# Patient Record
Sex: Female | Born: 1990 | Race: Asian | Hispanic: No | Marital: Married | Smoking: Never smoker
Health system: Southern US, Community
[De-identification: ages and names within clinical notes are randomized; demographics above are authoritative.]

## PROBLEM LIST (undated history)

## (undated) DIAGNOSIS — E039 Hypothyroidism, unspecified: Secondary | ICD-10-CM

## (undated) DIAGNOSIS — E079 Disorder of thyroid, unspecified: Secondary | ICD-10-CM

## (undated) HISTORY — DX: Hypothyroidism, unspecified: E03.9

## (undated) HISTORY — DX: Disorder of thyroid, unspecified: E07.9

---

## 2014-05-13 ENCOUNTER — Other Ambulatory Visit: Payer: Self-pay | Admitting: Infectious Disease

## 2014-05-13 ENCOUNTER — Ambulatory Visit
Admission: RE | Admit: 2014-05-13 | Discharge: 2014-05-13 | Disposition: A | Discharge status: Home / Self Care | Payer: No Typology Code available for payment source | Source: Ambulatory Visit | Attending: Infectious Disease | Admitting: Infectious Disease

## 2014-05-13 DIAGNOSIS — R7612 Nonspecific reaction to cell mediated immunity measurement of gamma interferon antigen response without active tuberculosis: Secondary | ICD-10-CM

## 2015-07-29 DIAGNOSIS — Z0000 Encounter for general adult medical examination without abnormal findings: Secondary | ICD-10-CM | POA: Diagnosis not present

## 2015-07-29 DIAGNOSIS — N945 Secondary dysmenorrhea: Secondary | ICD-10-CM | POA: Diagnosis not present

## 2015-07-29 DIAGNOSIS — R5383 Other fatigue: Secondary | ICD-10-CM | POA: Diagnosis not present

## 2015-07-29 DIAGNOSIS — E559 Vitamin D deficiency, unspecified: Secondary | ICD-10-CM | POA: Diagnosis not present

## 2015-07-29 DIAGNOSIS — R0602 Shortness of breath: Secondary | ICD-10-CM | POA: Diagnosis not present

## 2015-07-29 DIAGNOSIS — E282 Polycystic ovarian syndrome: Secondary | ICD-10-CM | POA: Diagnosis not present

## 2015-07-29 DIAGNOSIS — Z1389 Encounter for screening for other disorder: Secondary | ICD-10-CM | POA: Diagnosis not present

## 2015-08-03 ENCOUNTER — Emergency Department (HOSPITAL_COMMUNITY): Payer: BLUE CROSS/BLUE SHIELD

## 2015-08-03 ENCOUNTER — Emergency Department (HOSPITAL_COMMUNITY)
Admission: EM | Admit: 2015-08-03 | Discharge: 2015-08-03 | Disposition: A | Discharge status: Home / Self Care | Payer: BLUE CROSS/BLUE SHIELD | Attending: Emergency Medicine | Admitting: Emergency Medicine

## 2015-08-03 ENCOUNTER — Encounter (HOSPITAL_COMMUNITY): Payer: Self-pay | Admitting: *Deleted

## 2015-08-03 DIAGNOSIS — R0602 Shortness of breath: Secondary | ICD-10-CM | POA: Insufficient documentation

## 2015-08-03 DIAGNOSIS — Z3202 Encounter for pregnancy test, result negative: Secondary | ICD-10-CM | POA: Diagnosis not present

## 2015-08-03 DIAGNOSIS — D649 Anemia, unspecified: Secondary | ICD-10-CM | POA: Diagnosis not present

## 2015-08-03 LAB — CBC WITH DIFFERENTIAL/PLATELET
Basophils Absolute: 0 10*3/uL (ref 0.0–0.1)
Basophils Relative: 0 %
EOS PCT: 2 %
Eosinophils Absolute: 0.2 10*3/uL (ref 0.0–0.7)
HEMATOCRIT: 31.9 % — AB (ref 36.0–46.0)
Hemoglobin: 9.7 g/dL — ABNORMAL LOW (ref 12.0–15.0)
Lymphocytes Relative: 27 %
Lymphs Abs: 2 10*3/uL (ref 0.7–4.0)
MCH: 21.3 pg — ABNORMAL LOW (ref 26.0–34.0)
MCHC: 30.4 g/dL (ref 30.0–36.0)
MCV: 70.1 fL — AB (ref 78.0–100.0)
MONOS PCT: 7 %
Monocytes Absolute: 0.5 10*3/uL (ref 0.1–1.0)
NEUTROS PCT: 64 %
Neutro Abs: 4.8 10*3/uL (ref 1.7–7.7)
PLATELETS: 363 10*3/uL (ref 150–400)
RBC: 4.55 MIL/uL (ref 3.87–5.11)
RDW: 16.1 % — ABNORMAL HIGH (ref 11.5–15.5)
WBC: 7.5 10*3/uL (ref 4.0–10.5)

## 2015-08-03 LAB — BASIC METABOLIC PANEL
ANION GAP: 10 (ref 5–15)
BUN: 7 mg/dL (ref 6–20)
CO2: 24 mmol/L (ref 22–32)
Calcium: 9.7 mg/dL (ref 8.9–10.3)
Chloride: 100 mmol/L — ABNORMAL LOW (ref 101–111)
Creatinine, Ser: 0.77 mg/dL (ref 0.44–1.00)
GFR calc Af Amer: >60 mL/min (ref >60)
Glucose, Bld: 92 mg/dL (ref 65–99)
POTASSIUM: 3.5 mmol/L (ref 3.5–5.1)
Sodium: 134 mmol/L — ABNORMAL LOW (ref 135–145)

## 2015-08-03 LAB — I-STAT TROPONIN, ED: Troponin i, poc: 0 ng/mL (ref 0.00–0.08)

## 2015-08-03 LAB — I-STAT BETA HCG BLOOD, ED (MC, WL, AP ONLY)

## 2015-08-03 LAB — D-DIMER, QUANTITATIVE (NOT AT ARMC): D DIMER QUANT: 0.45 ug{FEU}/mL (ref 0.00–0.50)

## 2015-08-03 NOTE — Discharge Instructions (Signed)
Anemia, Nonspecific °Anemia is a condition in which the concentration of red blood cells or hemoglobin in the blood is below normal. Hemoglobin is a substance in red blood cells that carries oxygen to the tissues of the body. Anemia results in not enough oxygen reaching these tissues.  °CAUSES  °Common causes of anemia include:  °· Excessive bleeding. Bleeding may be internal or external. This includes excessive bleeding from periods (in women) or from the intestine.   °· Poor nutrition.   °· Chronic kidney, thyroid, and liver disease.  °· Bone marrow disorders that decrease red blood cell production. °· Cancer and treatments for cancer. °· HIV, AIDS, and their treatments. °· Spleen problems that increase red blood cell destruction. °· Blood disorders. °· Excess destruction of red blood cells due to infection, medicines, and autoimmune disorders. °SIGNS AND SYMPTOMS  °· Minor weakness.   °· Dizziness.   °· Headache. °· Palpitations.   °· Shortness of breath, especially with exercise.   °· Paleness. °· Cold sensitivity. °· Indigestion. °· Nausea. °· Difficulty sleeping. °· Difficulty concentrating. °Symptoms may occur suddenly or they may develop slowly.  °DIAGNOSIS  °Additional blood tests are often needed. These help your health care provider determine the best treatment. Your health care provider will check your stool for blood and look for other causes of blood loss.  °TREATMENT  °Treatment varies depending on the cause of the anemia. Treatment can include:  °· Supplements of iron, vitamin B12, or folic acid.   °· Hormone medicines.   °· A blood transfusion. This may be needed if blood loss is severe.   °· Hospitalization. This may be needed if there is significant continual blood loss.   °· Dietary changes. °· Spleen removal. °HOME CARE INSTRUCTIONS °Keep all follow-up appointments. It often takes many weeks to correct anemia, and having your health care provider check on your condition and your response to  treatment is very important. °SEEK IMMEDIATE MEDICAL CARE IF:  °· You develop extreme weakness, shortness of breath, or chest pain.   °· You become dizzy or have trouble concentrating. °· You develop heavy vaginal bleeding.   °· You develop a rash.   °· You have bloody or black, tarry stools.   °· You faint.   °· You vomit up blood.   °· You vomit repeatedly.   °· You have abdominal pain. °· You have a fever or persistent symptoms for more than 2-3 days.   °· You have a fever and your symptoms suddenly get worse.   °· You are dehydrated.   °MAKE SURE YOU: °· Understand these instructions. °· Will watch your condition. °· Will get help right away if you are not doing well or get worse. °  °This information is not intended to replace advice given to you by your health care provider. Make sure you discuss any questions you have with your health care provider. °  °Document Released: 03/21/2004 Document Revised: 10/14/2012 Document Reviewed: 08/07/2012 °Elsevier Interactive Patient Education ©2016 [employer] ° °Shortness of Breath °Shortness of breath means you have trouble breathing. It could also mean that you have a medical problem. You should get immediate medical care for shortness of breath. °CAUSES  °· Not enough oxygen in the air such as with high altitudes or a smoke-filled room. °· Certain lung diseases, infections, or problems. °· Heart disease or conditions, such as angina or heart failure. °· Low red blood cells (anemia). °· Poor physical fitness, which can cause shortness of breath when you exercise. °· Chest or back injuries or stiffness. °· Being overweight. °· Smoking. °· Anxiety, which can make   you feel like you are not getting enough air. °DIAGNOSIS  °Serious medical problems can often be found during your physical exam. Tests may also be done to determine why you are having shortness of breath. Tests may include: °· Chest X-rays. °· Lung function tests. °· Blood tests. °· An electrocardiogram  (ECG). °· An ambulatory electrocardiogram. An ambulatory ECG records your heartbeat patterns over a 24-hour period. °· Exercise testing. °· A transthoracic echocardiogram (TTE). During echocardiography, sound waves are used to evaluate how blood flows through your heart. °· A transesophageal echocardiogram (TEE). °· Imaging scans. °Your health care provider may not be able to find a cause for your shortness of breath after your exam. In this case, it is important to have a follow-up exam with your health care provider as directed.  °TREATMENT  °Treatment for shortness of breath depends on the cause of your symptoms and can vary greatly. °HOME CARE INSTRUCTIONS  °· Do not smoke. Smoking is a common cause of shortness of breath. If you smoke, ask for help to quit. °· Avoid being around chemicals or things that may bother your breathing, such as paint fumes and dust. °· Rest as needed. Slowly resume your usual activities. °· If medicines were prescribed, take them as directed for the full length of time directed. This includes oxygen and any inhaled medicines. °· Keep all follow-up appointments as directed by your health care provider. °SEEK MEDICAL CARE IF:  °· Your condition does not improve in the time expected. °· You have a hard time doing your normal activities even with rest. °· You have any new symptoms. °SEEK IMMEDIATE MEDICAL CARE IF:  °· Your shortness of breath gets worse. °· You feel light-headed, faint, or develop a cough not controlled with medicines. °· You start coughing up blood. °· You have pain with breathing. °· You have chest pain or pain in your arms, shoulders, or abdomen. °· You have a fever. °· You are unable to walk up stairs or exercise the way you normally do. °MAKE SURE YOU: °· Understand these instructions. °· Will watch your condition. °· Will get help right away if you are not doing well or get worse. °  °This information is not intended to replace advice given to you by your health  care provider. Make sure you discuss any questions you have with your health care provider. °  °Document Released: 11/06/2000 Document Revised: 02/16/2013 Document Reviewed: 04/29/2011 °Elsevier Interactive Patient Education ©2016 [employer] ° °

## 2015-08-03 NOTE — ED Provider Notes (Signed)
By signing my name below, I, Marisue Humble, attest that this documentation has been prepared under the direction and in the presence of Pammy Vesey N Janah Mcculloh, DO . Electronically Signed: Marisue Humble, Scribe. 08/03/2015. 3:57 AM.  TIME SEEN: 3:54 AM  CHIEF COMPLAINT: Shortness of breath  HPI: HPI Comments:  Brooke Montgomery is a 25 y.o. female with no peritnent PMHx who presents to the Emergency Department complaining of intermittent episodes of shortness of breath for the past week, worsened when laying down. She was having difficulty sleeping due to SOB which prompted her visit tonight. Pt reports associated "giddiness" and tremors in her hands. Pt was evaluated by an internist 5 days ago because she has had menstrual bleeding with blood clots for 20-22 days; she states this is normal for her. She was given a birth control pill and Phentermine for weight loss. Her shortness of breath worsened after taking Phentermine. Denies fever, cough, chest pain, vomiting, diarrhea, pain or swelling in legs, h/o blood clots, recent surgery, recent travel, 3or h/o smoking.   ROS: See HPI Constitutional: no fever  Eyes: no drainage  ENT: no runny nose   Cardiovascular:  no chest pain  Resp: SOB  GI: no vomiting GU: no dysuria Integumentary: no rash  Allergy: no hives  Musculoskeletal: no leg swelling  Neurological: no slurred speech ROS otherwise negative  PAST MEDICAL HISTORY/PAST SURGICAL HISTORY:  History reviewed. No pertinent past medical history.  MEDICATIONS:  Prior to Admission medications   Not on File    ALLERGIES:  No Known Allergies  SOCIAL HISTORY:  Social History  Substance Use Topics  . Smoking status: Never Smoker   . Smokeless tobacco: Not on file  . Alcohol Use: No    FAMILY HISTORY: No family history on file.  EXAM: BP 109/79 mmHg  Pulse 88  Temp(Src) 97.7 F (36.5 C) (Oral)  Resp 20  Ht  (1.575 m)  Wt 186 lb (84.369 kg)  BMI 34.01 kg/m2  SpO2 100%   LMP 07/10/2015 CONSTITUTIONAL: Alert and oriented and responds appropriately to questions. Well-appearing; well-nourished HEAD: Normocephalic EYES: Conjunctivae clear, PERRL ENT: normal nose; no rhinorrhea; moist mucous membranes NECK: Supple, no meningismus, no LAD  CARD: RRR; S1 and S2 appreciated; no murmurs, no clicks, no rubs, no gallops RESP: Normal chest excursion without splinting or tachypnea; breath sounds clear and equal bilaterally; no wheezes, no rhonchi, no rales, no hypoxia or respiratory distress, speaking full sentences ABD/GI: Normal bowel sounds; non-distended; soft, non-tender, no rebound, no guarding, no peritoneal signs BACK:  The back appears normal and is non-tender to palpation, there is no CVA tenderness EXT: Normal ROM in all joints; non-tender to palpation; no edema; normal capillary refill; no cyanosis, no calf tenderness or swelling    SKIN: Normal color for age and race; warm; no rash NEURO: Moves all extremities equally, sensation to light touch intact diffusely, cranial nerves II through XII intact PSYCH: The patient's mood and manner are appropriate. Grooming and personal hygiene are appropriate.  MEDICAL DECISION MAKING: Patient here shortness of breath. Denies any chest pain. EKG shows no ischemic changes, interval abnormalities, arrhythmia. Lungs are clear to auscultation without hypoxia. Differential diagnosis includes anemia, less likely ACS or PE, less likely pneumonia. We'll obtain labs, chest x-ray.  ED PROGRESS: 5:00 AM  Pt's labs show anemia with hemoglobin of 9.7. This could be concerning to some of her symptoms but I do not feel is low enough that she needs a blood transfusion. She reports mild vaginal  bleeding at this time. No hemorrhaging. No abdominal pain. Troponin negative, d-dimer negative, chest x-ray clear. Have recommended that she stopped taking phentermine and follow-up with her primary care physician. I do not feel she needs further emergent  workup. No sign of life any illness present currently. She is not pregnant.   At this time, I do not feel there is any life-threatening condition present. I have reviewed and discussed all results (EKG, imaging, lab, urine as appropriate), exam findings with patient. I have reviewed nursing notes and appropriate previous records.  I feel the patient is safe to be discharged home without further emergent workup. Discussed usual and customary return precautions. Patient and family (if present) verbalize understanding and are comfortable with this plan.  Patient will follow-up with their primary care provider. If they do not have a primary care provider, information for follow-up has been provided to them. All questions have been answered.       EKG Interpretation  Date/Time:  Thursday August 03 2015 01:43:01 EDT Ventricular Rate:  89 PR Interval:  146 QRS Duration: 78 QT Interval:  366 QTC Calculation: 445 R Axis:   60 Text Interpretation:  Normal sinus rhythm Normal ECG No previous ECGs available Confirmed by LITTLE MD, RACHEL [phone]([id number]) on 08/03/2015 1:43:39 AM       I personally performed the services described in this documentation, which was scribed in my presence. The recorded information has been reviewed and is accurate.    Layla MawKristen N Arlissa Monteverde, DO 08/03/15 [phone]

## 2015-08-03 NOTE — ED Notes (Signed)
Patient transported to x-ray. ?

## 2015-08-03 NOTE — ED Notes (Signed)
Patient verbalized understanding of discharge instructions and denies any further needs or questions at this time. VS stable. Patient ambulatory with steady gait.  

## 2015-08-03 NOTE — ED Notes (Signed)
The pt has been sob and anxious for one week.  She saw her doctor and was given phentermine  Her breathing and anxiety  Has become worse since she has taken the med  lmp may 15

## 2016-08-01 ENCOUNTER — Other Ambulatory Visit (HOSPITAL_COMMUNITY)
Admission: RE | Admit: 2016-08-01 | Discharge: 2016-08-01 | Disposition: A | Discharge status: Home / Self Care | Payer: BLUE CROSS/BLUE SHIELD | Source: Ambulatory Visit | Attending: Obstetrics and Gynecology | Admitting: Obstetrics and Gynecology

## 2016-08-01 ENCOUNTER — Ambulatory Visit (INDEPENDENT_AMBULATORY_CARE_PROVIDER_SITE_OTHER): Payer: BLUE CROSS/BLUE SHIELD | Admitting: Obstetrics and Gynecology

## 2016-08-01 ENCOUNTER — Encounter: Payer: Self-pay | Admitting: Obstetrics and Gynecology

## 2016-08-01 VITALS — BP 118/74 | HR 80 | Resp 16 | Ht 61.5 in | Wt 194.0 lb

## 2016-08-01 DIAGNOSIS — E663 Overweight: Secondary | ICD-10-CM | POA: Diagnosis not present

## 2016-08-01 DIAGNOSIS — Z23 Encounter for immunization: Secondary | ICD-10-CM

## 2016-08-01 DIAGNOSIS — Z0000 Encounter for general adult medical examination without abnormal findings: Secondary | ICD-10-CM

## 2016-08-01 DIAGNOSIS — E038 Other specified hypothyroidism: Secondary | ICD-10-CM

## 2016-08-01 DIAGNOSIS — Z01419 Encounter for gynecological examination (general) (routine) without abnormal findings: Secondary | ICD-10-CM | POA: Insufficient documentation

## 2016-08-01 DIAGNOSIS — E039 Hypothyroidism, unspecified: Secondary | ICD-10-CM | POA: Insufficient documentation

## 2016-08-01 DIAGNOSIS — N926 Irregular menstruation, unspecified: Secondary | ICD-10-CM

## 2016-08-01 DIAGNOSIS — L680 Hirsutism: Secondary | ICD-10-CM | POA: Diagnosis not present

## 2016-08-01 DIAGNOSIS — E282 Polycystic ovarian syndrome: Secondary | ICD-10-CM | POA: Diagnosis not present

## 2016-08-01 DIAGNOSIS — Z124 Encounter for screening for malignant neoplasm of cervix: Secondary | ICD-10-CM | POA: Diagnosis not present

## 2016-08-01 LAB — POCT URINE PREGNANCY: Preg Test, Ur: NEGATIVE

## 2016-08-01 MED ORDER — MEDROXYPROGESTERONE ACETATE 5 MG PO TABS: ORAL_TABLET | ORAL | 1 refills | Status: DC

## 2016-08-01 NOTE — Patient Instructions (Addendum)
Oral Contraception Information Oral contraceptive pills (OCPs) are medicines taken to prevent pregnancy. OCPs work by preventing the ovaries from releasing eggs. The hormones in OCPs also cause the cervical mucus to thicken, preventing the sperm from entering the uterus. The hormones also cause the uterine lining to become thin, not allowing a fertilized egg to attach to the inside of the uterus. OCPs are highly effective when taken exactly as prescribed. However, OCPs do not prevent sexually transmitted diseases (STDs). Safe sex practices, such as using condoms along with the pill, can help prevent STDs. Before taking the pill, you may have a physical exam and Pap test. Your health care provider may order blood tests. The health care provider will make sure you are a good candidate for oral contraception. Discuss with your health care provider the possible side effects of the OCP you may be prescribed. When starting an OCP, it can take 2 to 3 months for the body to adjust to the changes in hormone levels in your body. Types of oral contraception  The combination pill-This pill contains estrogen and progestin (synthetic progesterone) hormones. The combination pill comes in 21-day, 28-day, or 91-day packs. Some types of combination pills are meant to be taken continuously (365-day pills). With 21-day packs, you do not take pills for 7 days after the last pill. With 28-day packs, the pill is taken every day. The last 7 pills are without hormones. Certain types of pills have more than 21 hormone-containing pills. With 91-day packs, the first 84 pills contain both hormones, and the last 7 pills contain no hormones or contain estrogen only.  The minipill-This pill contains the progesterone hormone only. The pill is taken every day continuously. It is very important to take the pill at the same time each day. The minipill comes in packs of 28 pills. All 28 pills contain the hormone. Advantages of oral  contraceptive pills  Decreases premenstrual symptoms.  Treats menstrual period cramps.  Regulates the menstrual cycle.  Decreases a heavy menstrual flow.  May treatacne, depending on the type of pill.  Treats abnormal uterine bleeding.  Treats polycystic ovarian syndrome.  Treats endometriosis.  Can be used as emergency contraception. Things that can make oral contraceptive pills less effective OCPs can be less effective if:  You forget to take the pill at the same time every day.  You have a stomach or intestinal disease that lessens the absorption of the pill.  You take OCPs with other medicines that make OCPs less effective, such as antibiotics, certain HIV medicines, and some seizure medicines.  You take expired OCPs.  You forget to restart the pill on day 7, when using the packs of 21 pills.  Risks associated with oral contraceptive pills Oral contraceptive pills can sometimes cause side effects, such as:  Headache.  Nausea.  Breast tenderness.  Irregular bleeding or spotting.  Combination pills are also associated with a small increased risk of:  Blood clots.  Heart attack.  Stroke.  This information is not intended to replace advice given to you by your health care provider. Make sure you discuss any questions you have with your health care provider. Document Released: 05/04/2002 Document Revised: 07/20/2015 Document Reviewed: 08/02/2012 Elsevier Interactive Patient Education  2018 [employer] Inc.  Polycystic Ovarian Syndrome Polycystic ovarian syndrome (PCOS) is a common hormonal disorder among women of reproductive age. In most women with PCOS, many small fluid-filled sacs (cysts) grow on the ovaries, and the cysts are not part of a normal menstrual  cycle. PCOS can cause problems with your menstrual periods and make it difficult to get pregnant. It can also cause an increased risk of miscarriage with pregnancy. If it is not treated, PCOS can lead to  serious health problems, such as diabetes and heart disease. What are the causes? The cause of PCOS is not known, but it may be the result of a combination of certain factors, such as:  Irregular menstrual cycle.  High levels of certain hormones (androgens).  Problems with the hormone that helps to control blood sugar (insulin resistance).  Certain genes.  What increases the risk? This condition is more likely to develop in women who have a family history of PCOS. What are the signs or symptoms? Symptoms of PCOS may include:  Multiple ovarian cysts.  Infrequent periods or no periods.  Periods that are too frequent or too heavy.  Unpredictable periods.  Inability to get pregnant (infertility) because of not ovulating.  Increased growth of hair on the face, chest, stomach, back, thumbs, thighs, or toes.  Acne or oily skin. Acne may develop during adulthood, and it may not respond to treatment.  Pelvic pain.  Weight gain or obesity.  Patches of thickened and dark brown or black skin on the neck, arms, breasts, or thighs (acanthosis nigricans).  Excess hair growth on the face, chest, abdomen, or upper thighs (hirsutism).  How is this diagnosed? This condition is diagnosed based on:  Your medical history.  A physical exam, including a pelvic exam. Your health care provider may look for areas of increased hair growth on your skin.  Tests, such as: ? Ultrasound. This may be used to examine the ovaries and the lining of the uterus (endometrium) for cysts. ? Blood tests. These may be used to check levels of sugar (glucose), female hormone (testosterone), and female hormones (estrogen and progesterone) in your blood.  How is this treated? There is no cure for PCOS, but treatment can help to manage symptoms and prevent more health problems from developing. Treatment varies depending on:  Your symptoms.  Whether you want to have a baby or whether you need birth control  (contraception).  Treatment may include nutrition and lifestyle changes along with:  Progesterone hormone to start a menstrual period.  Birth control pills to help you have regular menstrual periods.  Medicines to make you ovulate, if you want to get pregnant.  Medicine to reduce excessive hair growth.  Surgery, in severe cases. This may involve making small holes in one or both of your ovaries. This decreases the amount of testosterone that your body produces.  Follow these instructions at home:  Take over-the-counter and prescription medicines only as told by your health care provider.  Follow a healthy meal plan. This can help you reduce the effects of PCOS. ? Eat a healthy diet that includes lean proteins, complex carbohydrates, fresh fruits and vegetables, low-fat dairy products, and healthy fats. Make sure to eat enough fiber.  If you are overweight, lose weight as told by your health care provider. ? Losing 10% of your body weight may improve symptoms. ? Your health care provider can determine how much weight loss is best for you and can help you lose weight safely.  Keep all follow-up visits as told by your health care provider. This is important. Contact a health care provider if:  Your symptoms do not get better with medicine.  You develop new symptoms. This information is not intended to replace advice given to you by your  health care provider. Make sure you discuss any questions you have with your health care provider. Document Released: 06/07/2004 Document Revised: 10/10/2015 Document Reviewed: 07/30/2015 Elsevier Interactive Patient Education  2018 [employer].  Diet for Polycystic Ovarian Syndrome Polycystic ovary syndrome (PCOS) is a disorder of the chemical messengers (hormones) that regulate menstruation. The condition causes important hormones to be out of balance. PCOS can:  Make your periods irregular or stop.  Cause cysts to develop on the  ovaries.  Make it difficult to get pregnant.  Stop your body from responding to the effects of insulin (insulin resistance), which can lead to obesity and diabetes.  Changing what you eat can help manage PCOS and improve your health. It can help you lose weight and improve the way your body uses insulin. What is my plan?  Eat breakfast, lunch, and dinner plus two snacks every day.  Include protein in each meal and snack.  Choose whole grains instead of products made with refined flour.  Eat a variety of foods.  Exercise regularly as told by your health care provider. What do I need to know about this eating plan? If you are overweight or obese, pay attention to how many calories you eat. Cutting down on calories can help you lose weight. Work with your health care provider or dietitian to figure out how many calories you need each day. What foods can I eat? Grains Whole grains, such as whole wheat. Whole-grain breads, crackers, cereals, and pasta. Unsweetened oatmeal, bulgur, barley, quinoa, or brown rice. Corn or whole-wheat flour tortillas. Vegetables  Lettuce. Spinach. Peas. Beets. Cauliflower. Cabbage. Broccoli. Carrots. Tomatoes. Squash. Eggplant. Herbs. Peppers. Onions. Cucumbers. Brussels sprouts. Fruits Berries. Bananas. Apples. Oranges. Grapes. Papaya. Mango. Pomegranate. Kiwi. Grapefruit. Cherries. Meats and Other Protein Sources Lean proteins, such as fish, chicken, beans, eggs, and tofu. Dairy Low-fat dairy products, such as skim milk, cheese sticks, and yogurt. Beverages Low-fat or fat-free drinks, such as water, low-fat milk, sugar-free drinks, and 100% fruit juice. Condiments Ketchup. Mustard. Barbecue sauce. Relish. Low-fat or fat-free mayonnaise. Fats and Oils Olive oil or canola oil. Walnuts and almonds. The items listed above may not be a complete list of recommended foods or beverages. Contact your dietitian for more options. What foods are not  recommended? Foods high in calories or fat. Fried foods. Sweets. Products made from refined white flour, including white bread, pastries, white rice, and pasta. The items listed above may not be a complete list of foods and beverages to avoid. Contact your dietitian for more information. This information is not intended to replace advice given to you by your health care provider. Make sure you discuss any questions you have with your health care provider. Document Released: 06/05/2015 Document Revised: 07/20/2015 Document Reviewed: 02/23/2014 Elsevier Interactive Patient Education  2018 Elsevier Inc.  EXERCISE AND DIET:  We recommended that you start or continue a regular exercise program for good health. Regular exercise means any activity that makes your heart beat faster and makes you sweat.  We recommend exercising at least 30 minutes per day at least 3 days a week, preferably 4 or 5.  We also recommend a diet low in fat and sugar.  Inactivity, poor dietary choices and obesity can cause diabetes, heart attack, stroke, and kidney damage, among others.    ALCOHOL AND SMOKING:  Women should limit their alcohol intake to no more than 7 drinks/beers/glasses of wine (combined, not each!) per week. Moderation of alcohol intake to this level decreases your risk  of breast cancer and liver damage. And of course, no recreational drugs are part of a healthy lifestyle.  And absolutely no smoking or even second hand smoke. Most people know smoking can cause heart and lung diseases, but did you know it also contributes to weakening of your bones? Aging of your skin?  Yellowing of your teeth and nails?  CALCIUM AND VITAMIN D:  Adequate intake of calcium and Vitamin D are recommended.  The recommendations for exact amounts of these supplements seem to change often, but generally speaking 600 mg of calcium (either carbonate or citrate) and 800 units of Vitamin D per day seems prudent. Certain women may benefit from  higher intake of Vitamin D.  If you are among these women, your doctor will have told you during your visit.    PAP SMEARS:  Pap smears, to check for cervical cancer or precancers,  have traditionally been done yearly, although recent scientific advances have shown that most women can have pap smears less often.  However, every woman still should have a physical exam from her gynecologist every year. It will include a breast check, inspection of the vulva and vagina to check for abnormal growths or skin changes, a visual exam of the cervix, and then an exam to evaluate the size and shape of the uterus and ovaries.  And after 25 years of age, a rectal exam is indicated to check for rectal cancers. We will also provide age appropriate advice regarding health maintenance, like when you should have certain vaccines, screening for sexually transmitted diseases, bone density testing, colonoscopy, mammograms, etc.   MAMMOGRAMS:  All women over 25 years old should have a yearly mammogram. Many facilities now offer a "3D" mammogram, which may cost around $50 extra out of pocket. If possible,  we recommend you accept the option to have the 3D mammogram performed.  It both reduces the number of women who will be called back for extra views which then turn out to be normal, and it is better than the routine mammogram at detecting truly abnormal areas.    COLONOSCOPY:  Colonoscopy to screen for colon cancer is recommended for all women at age 25.  We know, you hate the idea of the prep.  We agree, BUT, having colon cancer and not knowing it is worse!!  Colon cancer so often starts as a polyp that can be seen and removed at colonscopy, which can quite literally save your life!  And if your first colonoscopy is normal and you have no family history of colon cancer, most women don't have to have it again for 10 years.  Once every ten years, you can do something that may end up saving your life, right?  We will be happy to  help you get it scheduled when you are ready.  Be sure to check your insurance coverage so you understand how much it will cost.  It may be covered as a preventative service at no cost, but you should check your particular policy.

## 2016-08-01 NOTE — Progress Notes (Signed)
26 y.o. G0P0000 MarriedIndianF here for annual exam.   Menarche at age 68. Cycles q 3 months until 2015. In 2015 she started having monthly cycles. Since then she has had episodes of being irregular and then regular. In the last year cycles q 1-2 months, LMP was 05/24/16. Typically bleeds for 5 days, last cycle lasted for 15 days. At most she goes through a pad in 3 hours (large pads). No clots. Cramps are okay, some back pain.  Sexually active, using condoms for contraception. No dyspareunia.  She is hypothyroid, on thyroxine. She c/o hair growth on her face, full beard and mustache. She waxes every 2 weeks. No hair on her breasts of abdomen.   Period Duration (Days): 5-10 days Period Pattern: (!) Irregular Menstrual Flow: Moderate Menstrual Control: Maxi pad Menstrual Control Change Freq (Hours): every 4-6 hours on heavy days Dysmenorrhea: (!) Moderate Dysmenorrhea Symptoms: Cramping  Patient's last menstrual period was 05/24/2016.          Sexually active: Yes.    The current method of family planning is none.    Exercising: No.  The patient does not participate in regular exercise at present. Smoker:  no  Health Maintenance: Pap:  Never History of abnormal Pap:  no TDaP:  11/2014 Gardasil: No   reports that she has never smoked. She has never used smokeless tobacco. She reports that she does not drink alcohol or use drugs.She goes to [employer], getting PhD in cyber security. Husband is working. She has been in the [place] for 3 years.   No past medical history on file.  No past surgical history on file.  Current Outpatient Prescriptions  Medication Sig Dispense Refill  . UNABLE TO FIND Take 100 mcg by mouth daily. Med Name: Thyroxine Sodium Tablets     No current facility-administered medications for this visit.     No family history on file.  Review of Systems  Constitutional: Negative.   HENT: Negative.   Eyes: Negative.   Respiratory: Negative.   Cardiovascular: Negative.    Gastrointestinal: Negative.   Endocrine: Negative.   Genitourinary:       Irregular menses  Musculoskeletal: Negative.   Skin: Negative.   Allergic/Immunologic: Negative.   Neurological: Negative.   Hematological: Negative.   Psychiatric/Behavioral: Negative.   + hair loss  Exam:   BP 118/74 (BP Location: Right Arm, Patient Position: Sitting, Cuff Size: Large)   Pulse 80   Resp 16   Ht 5' 1.5" (1.562 m)   Wt 194 lb (88 kg)   LMP 05/24/2016   BMI 36.06 kg/m   Weight change: @WEIGHTCHANGE @ Height:   Height: 5' 1.5" (156.2 cm)  Ht Readings from Last 3 Encounters:  08/01/16 5' 1.5" (1.562 m)  08/03/15 5\' 2"  (1.575 m)    General appearance: alert, cooperative and appears stated age Head: Normocephalic, without obvious abnormality, atraumatic Neck: no adenopathy, supple, symmetrical, trachea midline and thyroid normal to inspection and palpation Lungs: clear to auscultation bilaterally Cardiovascular: regular rate and rhythm Breasts: normal appearance, no masses or tenderness Abdomen: soft, non-tender; bowel sounds normal; no masses,  no organomegaly Extremities: extremities normal, atraumatic, no cyanosis or edema Skin: Skin color, texture, turgor normal. No rashes or lesions. Marked hirsutism on her chin, lower abdomen, back and buttock Lymph nodes: Cervical, supraclavicular, and axillary nodes normal. No abnormal inguinal nodes palpated Neurologic: Grossly normal   Pelvic: External genitalia:  no lesions              Urethra:  normal appearing urethra with no masses, tenderness or lesions              Bartholins and Skenes: normal                 Vagina: normal appearing vagina with normal color and discharge, no lesions              Cervix: no cervical motion tenderness and no lesions               Bimanual Exam:  Uterus:  normal size, contour, position, consistency, mobility, non-tender              Adnexa: no mass, fullness, tenderness               Rectovaginal:  deferred, patient seemed uncomfortable with the whole exam   Chaperone was present for exam.  A:  Well Woman with normal exam  PCOS  Irregular cycles/oligomenorrhea  Overweight  Hirsutism  Hypothyroid, if TSH is normal will refill her script, otherwise will refer to Endocrinology     P:   Pap with reflex hpv  Screening labs, including HgbA1C  Hirsutism labs  Discussed option of OCP's for hirsutism, discussed Yaz, no contraindications risks reviewed  Discussed the importance of regular cycles for endometrial protection  UPT negative  We also discussed cyclic provera, she would like to use provera for now. Will send script to take for 5 days every other month if no cycle (check upt first), discussed the option of taking it every month  Start gardasil series today

## 2016-08-05 LAB — LIPID PANEL
CHOLESTEROL TOTAL: 186 mg/dL (ref 100–199)
Chol/HDL Ratio: 4.8 ratio — ABNORMAL HIGH (ref 0.0–4.4)
HDL: 39 mg/dL — AB (ref >39)
LDL CALC: 104 mg/dL — AB (ref 0–99)
Triglycerides: 214 mg/dL — ABNORMAL HIGH (ref 0–149)
VLDL CHOLESTEROL CAL: 43 mg/dL — AB (ref 5–40)

## 2016-08-05 LAB — COMPREHENSIVE METABOLIC PANEL
ALBUMIN: 4.5 g/dL (ref 3.5–5.5)
ALT: 13 IU/L (ref 0–32)
AST: 9 IU/L (ref 0–40)
Albumin/Globulin Ratio: 1.7 (ref 1.2–2.2)
Alkaline Phosphatase: 80 IU/L (ref 39–117)
BUN/Creatinine Ratio: 12 (ref 9–23)
BUN: 8 mg/dL (ref 6–20)
CO2: 24 mmol/L (ref 18–29)
CREATININE: 0.65 mg/dL (ref 0.57–1.00)
Calcium: 9.4 mg/dL (ref 8.7–10.2)
Chloride: 103 mmol/L (ref 96–106)
GFR, EST AFRICAN AMERICAN: 143 mL/min/{1.73_m2} (ref >59)
GFR, EST NON AFRICAN AMERICAN: 124 mL/min/{1.73_m2} (ref >59)
GLUCOSE: 99 mg/dL (ref 65–99)
Globulin, Total: 2.7 g/dL (ref 1.5–4.5)
POTASSIUM: 4.2 mmol/L (ref 3.5–5.2)
Sodium: 140 mmol/L (ref 134–144)
Total Protein: 7.2 g/dL (ref 6.0–8.5)

## 2016-08-05 LAB — CYTOLOGY - PAP: Diagnosis: NEGATIVE

## 2016-08-05 LAB — CBC
HEMOGLOBIN: 10.7 g/dL — AB (ref 11.1–15.9)
Hematocrit: 34.1 % (ref 34.0–46.6)
MCH: 23.4 pg — AB (ref 26.6–33.0)
MCHC: 31.4 g/dL — AB (ref 31.5–35.7)
MCV: 75 fL — ABNORMAL LOW (ref 79–97)
Platelets: 359 10*3/uL (ref 150–379)
RBC: 4.58 x10E6/uL (ref 3.77–5.28)
RDW: 16.8 % — AB (ref 12.3–15.4)
WBC: 9 10*3/uL (ref 3.4–10.8)

## 2016-08-05 LAB — TESTT+TESTF+SHBG
SEX HORMONE BINDING: 21.4 nmol/L — AB (ref 24.6–122.0)
TESTOSTERONE FREE: 3.6 pg/mL (ref 0.0–4.2)
Testosterone, total: 78.8 ng/dL — ABNORMAL HIGH (ref 10.0–55.0)

## 2016-08-05 LAB — TSH: TSH: 4.98 u[IU]/mL — ABNORMAL HIGH (ref 0.450–4.500)

## 2016-08-05 LAB — DHEA-SULFATE: DHEA-SO4: 277.4 ug/dL (ref 84.8–378.0)

## 2016-08-05 LAB — 17-HYDROXYPROGESTERONE: 17 HYDROXYPROGESTERONE: 86 ng/dL

## 2016-08-05 LAB — PROLACTIN: Prolactin: 7.8 ng/mL (ref 4.8–23.3)

## 2016-08-05 LAB — HEMOGLOBIN A1C
Est. average glucose Bld gHb Est-mCnc: 117 mg/dL
Hgb A1c MFr Bld: 5.7 % — ABNORMAL HIGH (ref 4.8–5.6)

## 2016-08-07 ENCOUNTER — Telehealth: Payer: Self-pay

## 2016-08-07 NOTE — Telephone Encounter (Signed)
-----   Message from Romualdo BolkJill Evelyn Jertson, MD sent at 08/06/2016  8:01 PM EDT ----- Please let the patient know that she is anemic. Please see if you can add a ferritin to her lab work.  Her TSH is slightly high, please increase her synthroid to 125 mcg a day and have her return for a TSH in 6 weeks.  She has pre-diabetes, her lipid panel is abnormal should f/u with her primary MD.  Her testosterone level is slightly elevated, this goes along with polycystic ovarian syndrome and can explain her hair growth. Weight loss could help decrease her hormonal imbalance.  The rest of her lab work and her pap were normal. 02 recall. Please send a copy of the lab work to her primary MD

## 2016-08-08 MED ORDER — LEVOTHYROXINE SODIUM 125 MCG PO TABS: 125.0000 ug | ORAL_TABLET | Freq: Every day | ORAL | 1 refills | Status: DC

## 2016-08-08 NOTE — Telephone Encounter (Signed)
Spoke with patient. Advised of results as seen below from Dr.Jerston. Patient verbalizes understanding. Patient declines to return for ferritin level at this time. States she will only have it checked at her 6 week lab recheck for TSH. Aware of importance of having it checked earlier and again declines. Appointment scheduled for 09/19/2016 at 1:30 pm. Rx for Synthroid 125 mcg take 1 tablet daily #30 1RF sent to pharmacy on file. Results sent to PCP Dr. Wynelle LinkSun. Aware of need to follow up with PCP. 02 recall placed.  Routing to provider for final review. Patient agreeable to disposition. Will close encounter.

## 2016-09-19 ENCOUNTER — Other Ambulatory Visit (INDEPENDENT_AMBULATORY_CARE_PROVIDER_SITE_OTHER): Payer: BLUE CROSS/BLUE SHIELD

## 2016-09-19 DIAGNOSIS — R899 Unspecified abnormal finding in specimens from other organs, systems and tissues: Secondary | ICD-10-CM

## 2016-09-20 LAB — TSH: TSH: 0.117 u[IU]/mL — ABNORMAL LOW (ref 0.450–4.500)

## 2016-09-20 LAB — FERRITIN: Ferritin: 6 ng/mL — ABNORMAL LOW (ref 15–150)

## 2016-09-24 ENCOUNTER — Telehealth: Payer: Self-pay | Admitting: *Deleted

## 2016-09-24 MED ORDER — LEVOTHYROXINE SODIUM 112 MCG PO TABS: 112.0000 ug | ORAL_TABLET | Freq: Every day | ORAL | 1 refills | Status: DC

## 2016-09-24 NOTE — Telephone Encounter (Signed)
Left message to call regarding lab results (both labs notes) -eh

## 2016-09-24 NOTE — Telephone Encounter (Signed)
Sent in RX for levothyroxine with 1 refill Spoke with patient and went over results and recommendations. Sent labs to PCP. Patient wants to follow up with PCP for all labs    Please let the patient know that her TSH is now too low. Please decrease her dose to 112 mg of synthroid. Please call it in for one month, with one refill. She should have her TSH rechecked in 6 weeks, with her primary. Please send a copy of her labs to her primary.

## 2016-09-24 NOTE — Telephone Encounter (Signed)
Returning a call to Elaine.  °

## 2016-09-24 NOTE — Telephone Encounter (Signed)
-----   Message from Romualdo BolkJill Evelyn Jertson, MD sent at 09/23/2016  5:22 PM EDT ----- Her ferritin is very low, she should start on ferrex 150 mg po qd. F/U CBC and Ferritin in 6 weeks.

## 2016-10-01 ENCOUNTER — Encounter: Payer: BLUE CROSS/BLUE SHIELD | Admitting: *Deleted

## 2016-10-01 NOTE — Progress Notes (Signed)
This encounter was created in error - please disregard.

## 2016-12-18 DIAGNOSIS — N911 Secondary amenorrhea: Secondary | ICD-10-CM | POA: Diagnosis not present

## 2016-12-22 ENCOUNTER — Other Ambulatory Visit: Payer: Self-pay | Admitting: Obstetrics and Gynecology

## 2016-12-23 ENCOUNTER — Telehealth: Payer: Self-pay | Admitting: Obstetrics and Gynecology

## 2016-12-23 DIAGNOSIS — E038 Other specified hypothyroidism: Secondary | ICD-10-CM

## 2016-12-23 MED ORDER — LEVOTHYROXINE SODIUM 112 MCG PO TABS: 112.0000 ug | ORAL_TABLET | Freq: Every day | ORAL | 0 refills | Status: DC

## 2016-12-23 NOTE — Telephone Encounter (Signed)
Call to patient. Advised that Dr Oscar LaJertson has refilled one 30 day supply. Will need to establish with PCP for additional refills.  States she does not have a preference on who to see. [address] and with 10-15 minute drive is fine. Advised will depend on what office has availability.  We will call back tomorrow with PCP options.

## 2016-12-23 NOTE — Telephone Encounter (Signed)
Medication refill request: levothyroxine  Last AEX:  08-01-16  Next AEX: not scheduled  Last MMG (if hormonal medication request): N/A Refill authorized: please advise

## 2016-12-23 NOTE — Telephone Encounter (Signed)
Her last TSH was low, her dose was adjusted by me, with the instructions for her to f/u with her primary. She needs repeat lab work and this should be managed by her primary.

## 2016-12-23 NOTE — Telephone Encounter (Signed)
Left message for patient to contact to pharmacy to send refill request to PCP -eh

## 2016-12-23 NOTE — Telephone Encounter (Signed)
Please let the patient know that a one month supply was sent. Can we help her get in with a primary please.   CC: Fulton MoleSally Yeakly

## 2016-12-23 NOTE — Telephone Encounter (Signed)
Patient calling to speak with nurse about her thyroid prescription.

## 2016-12-23 NOTE — Telephone Encounter (Signed)
Patient returned call, will call primary care for refill.   Routing to provider for final review. Patient agreeable to disposition. Will close encounter.    Cc: Nigel SloopElaine Hanner LPN

## 2016-12-23 NOTE — Telephone Encounter (Signed)
Spoke with patient. Patient request to know why levothyroxine RX denied? Reviewed refill encounter dated 10/28 and advised of recommendations per Dr. Oscar LaJertson.   Patient states she has been out of medication for 2 days and request refill until OV can be scheduled with PCP. Patient states she was unaware she was to f/u with PCP. Advised patient of recommendations per labs dated 09/19/16. Patient states she no longer has a PCP, needs RX until appointment can be scheduled.   Advised patient can assist with referral to PCP, will review with Dr. Oscar LaJertson and return call, patient is agreeable.   Dr. Oscar LaJertson, please review and advise?

## 2016-12-24 NOTE — Telephone Encounter (Signed)
Spoke with Olegario MessierKathy. Patient scheduled with Dr. Salomon FickBanks on 01/01/17 at 2pm, arrive at 1:45pm. New patient packet to be mailed to patient, advise $50 no show fee, require 24 hours notice for cancellation.

## 2016-12-24 NOTE — Telephone Encounter (Signed)
Left message to call Kasey Hansell at [phone].  

## 2016-12-25 NOTE — Telephone Encounter (Signed)
Spoke with patient, advised of appointment details as seen below. Patient verbalizes understanding and is agreeable.   Routing to provider for final review. Patient is agreeable to disposition. Will close encounter.  Cc; Soundra Pilonosa Davis; Billie RuddySally Yeakley, RN

## 2017-01-01 ENCOUNTER — Encounter: Payer: Self-pay | Admitting: Family Medicine

## 2017-01-01 ENCOUNTER — Ambulatory Visit: Payer: BLUE CROSS/BLUE SHIELD | Admitting: Family Medicine

## 2017-01-01 VITALS — BP 110/80 | HR 79 | Temp 98.4°F | Ht 61.5 in | Wt 174.0 lb

## 2017-01-01 DIAGNOSIS — Z8742 Personal history of other diseases of the female genital tract: Secondary | ICD-10-CM | POA: Diagnosis not present

## 2017-01-01 DIAGNOSIS — E039 Hypothyroidism, unspecified: Secondary | ICD-10-CM | POA: Diagnosis not present

## 2017-01-01 DIAGNOSIS — Z7689 Persons encountering health services in other specified circumstances: Secondary | ICD-10-CM | POA: Diagnosis not present

## 2017-01-01 LAB — TSH: TSH: 0.23 u[IU]/mL — ABNORMAL LOW (ref 0.35–4.50)

## 2017-01-01 NOTE — Progress Notes (Signed)
Patient presents to clinic today to establish care.  SUBJECTIVE: PMH: Pt is a 26 yo with pmh sig for Hypothyroidism and PCOS.  She is followed by OB/GYN.  Hypothyroidism: -Diagnosed around age 26. -Currently on Synthroid 112 mcg daily. -TSH in July 2018 was 0.117.  At this time patient's dose was adjusted. -Patient endorses thinning hair. -Patient denies constipation, diarrhea, palpitations, heat/cold intolerance. -Patient's father also has thyroid issues.  PCOS: -Patient endorsed history of irregular menses -Followed by OB/GYN -Started on progesterone times 2 months -Now on OCPs  Sprintec -Menses regular.  LMP 12/26/16  Allergies: NKDA  Past surgical history: None  Social history: Patient is married.  She is currently working on her PhD in Lobbyistcomputer science in [employer].  Patient is also working as a Arts development officerresearch assistant.  Patient denies tobacco, drug, alcohol use.  Family medical history: Mom-alive Dad-alive, HLD, HTN, stroke, thyroid issues   Past Medical History:  Diagnosis Date  . Hypothyroid   . Thyroid disease     History reviewed. No pertinent surgical history.  Current Outpatient Medications on File Prior to Visit  Medication Sig Dispense Refill  . levothyroxine (SYNTHROID) 112 MCG tablet Take 1 tablet (112 mcg total) by mouth daily before breakfast. 30 tablet 0  . medroxyPROGESTERone (PROVERA) 5 MG tablet Take one tablet a day for 5 days every other month if no spontaneous menses 15 tablet 1  . SPRINTEC 28 0.25-35 MG-MCG tablet Take 1 tablet daily by mouth.  3   No current facility-administered medications on file prior to visit.     No Known Allergies  Family History  Problem Relation Age of Onset  . Hypertension Father   . Thyroid disease Father   . Stroke Father     Social History   Socioeconomic History  . Marital status: Married    Spouse name: Not on file  . Number of children: Not on file  . Years of  education: Not on file  . Highest education level: Not on file  Social Needs  . Financial resource strain: Not on file  . Food insecurity - worry: Not on file  . Food insecurity - inability: Not on file  . Transportation needs - medical: Not on file  . Transportation needs - non-medical: Not on file  Occupational History  . Not on file  Tobacco Use  . Smoking status: Never Smoker  . Smokeless tobacco: Never Used  Substance and Sexual Activity  . Alcohol use: No  . Drug use: No  . Sexual activity: Yes    Partners: Male    Birth control/protection: Pill  Other Topics Concern  . Not on file  Social History Narrative  . Not on file    ROS General: Denies fever, chills, night sweats, changes in weight, changes in appetite    +hair loss HEENT: Denies headaches, ear pain, changes in vision, rhinorrhea, sore throat CV: Denies CP, palpitations, SOB, orthopnea Pulm: Denies SOB, cough, wheezing GI: Denies abdominal pain, nausea, vomiting, diarrhea, constipation GU: Denies dysuria, hematuria, frequency, vaginal discharge Msk: Denies muscle cramps, joint pains Neuro: Denies weakness, numbness, tingling Skin: Denies rashes, bruising Psych: Denies depression, anxiety, hallucinations  BP 110/80 (BP Location: Right Arm, Patient Position: Sitting, Cuff Size: Normal)   Pulse 79   Temp 98.4 F (36.9 C) (Oral)   Ht 5' 1.5" (1.562 m)   Wt 174 lb (78.9 kg)   LMP 12/26/2016 (Exact Date)   BMI 32.34 kg/m  Physical Exam Gen. Pleasant, well developed, well-nourished, in NAD HEENT - [place]/AT, PERRL, EOMI, conjunctive clear, no scleral icterus, no nasal drainage, pharynx without erythema or exudate. Hair long, no thinning patches/areas noted. Neck: No JVD, no thyromegaly Lungs: no use of accessory muscles, CTAB, no wheezes, rales or rhonchi Cardiovascular: RRR, No r/g/m, no peripheral edema Abdomen: BS present, soft, nontender,nondistended Musculoskeletal: No deformities, moves all four  extremities, no cyanosis or clubbing, normal tone Neuro:  A&Ox3, CN II-XII intact, normal gait Skin:  Warm, dry, intact, no lesions Psych: normal affect, mood appropriate  No results found for this or any previous visit (from the past 2160 hour(s)).  Assessment/Plan: Acquired hypothyroidism  -last TSH check 0.117 on 09/19/16 -will obtain labs as pt is symptomatic (hair loss) -For now continue Synthroid 112 mcg daily - Plan: TSH, Thyroid Peroxidase Antibody -f/u in 3 months  History of PCOS -followed by OB/GYN -Continue OCPs-Sprintec  Encounter to establish care -We reviewed the PMH, PSH, FH, SH, Meds and Allergies. -We provided refills for any medications we will prescribe as needed. -We addressed current concerns per orders and patient instructions. -We have asked for records for pertinent exams, studies, vaccines and notes from previous providers. -We have advised patient to follow up per instructions below.     Follow-up in 3 months.

## 2017-01-01 NOTE — Patient Instructions (Addendum)
Hypothyroidism Hypothyroidism is a disorder of the thyroid. The thyroid is a large gland that is located in the lower front of the neck. The thyroid releases hormones that control how the body works. With hypothyroidism, the thyroid does not make enough of these hormones. What are the causes? Causes of hypothyroidism may include:  Viral infections.  Pregnancy.  Your own defense system (immune system) attacking your thyroid.  Certain medicines.  Birth defects.  Past radiation treatments to your head or neck.  Past treatment with radioactive iodine.  Past surgical removal of part or all of your thyroid.  Problems with the gland that is located in the center of your brain (pituitary).  What are the signs or symptoms? Signs and symptoms of hypothyroidism may include:  Feeling as though you have no energy (lethargy).  Inability to tolerate cold.  Weight gain that is not explained by a change in diet or exercise habits.  Dry skin.  Coarse hair.  Menstrual irregularity.  Slowing of thought processes.  Constipation.  Sadness or depression.  How is this diagnosed? Your health care provider may diagnose hypothyroidism with blood tests and ultrasound tests. How is this treated? Hypothyroidism is treated with medicine that replaces the hormones that your body does not make. After you begin treatment, it may take several weeks for symptoms to go away. Follow these instructions at home:  Take medicines only as directed by your health care provider.  If you start taking any new medicines, tell your health care provider.  Keep all follow-up visits as directed by your health care provider. This is important. As your condition improves, your dosage needs may change. You will need to have blood tests regularly so that your health care provider can watch your condition. Contact a health care provider if:  Your symptoms do not get better with treatment.  You are taking thyroid  replacement medicine and: ? You sweat excessively. ? You have tremors. ? You feel anxious. ? You lose weight rapidly. ? You cannot tolerate heat. ? You have emotional swings. ? You have diarrhea. ? You feel weak. Get help right away if:  You develop chest pain.  You develop an irregular heartbeat.  You develop a rapid heartbeat. This information is not intended to replace advice given to you by your health care provider. Make sure you discuss any questions you have with your health care provider. Document Released: 02/11/2005 Document Revised: 07/20/2015 Document Reviewed: 06/29/2013 Elsevier Interactive Patient Education  2017 Elsevier Inc.  

## 2017-01-02 ENCOUNTER — Other Ambulatory Visit: Payer: Self-pay | Admitting: Family Medicine

## 2017-01-02 LAB — THYROID PEROXIDASE ANTIBODY: Thyroperoxidase Ab SerPl-aCnc: 698 IU/mL — ABNORMAL HIGH (ref <9)

## 2017-01-02 MED ORDER — LEVOTHYROXINE SODIUM 88 MCG PO TABS: 88.0000 ug | ORAL_TABLET | Freq: Every day | ORAL | 3 refills | Status: DC

## 2017-10-03 IMAGING — DX DG CHEST 2V
2 series · 2 of 2 positions shown · non-contrast
Comparison: Prior radiograph from 05/13/2014.

CLINICAL DATA: Initial evaluation acute shortness of breath,
anxiety.

EXAM:
CHEST  2 VIEW

[chest pa]
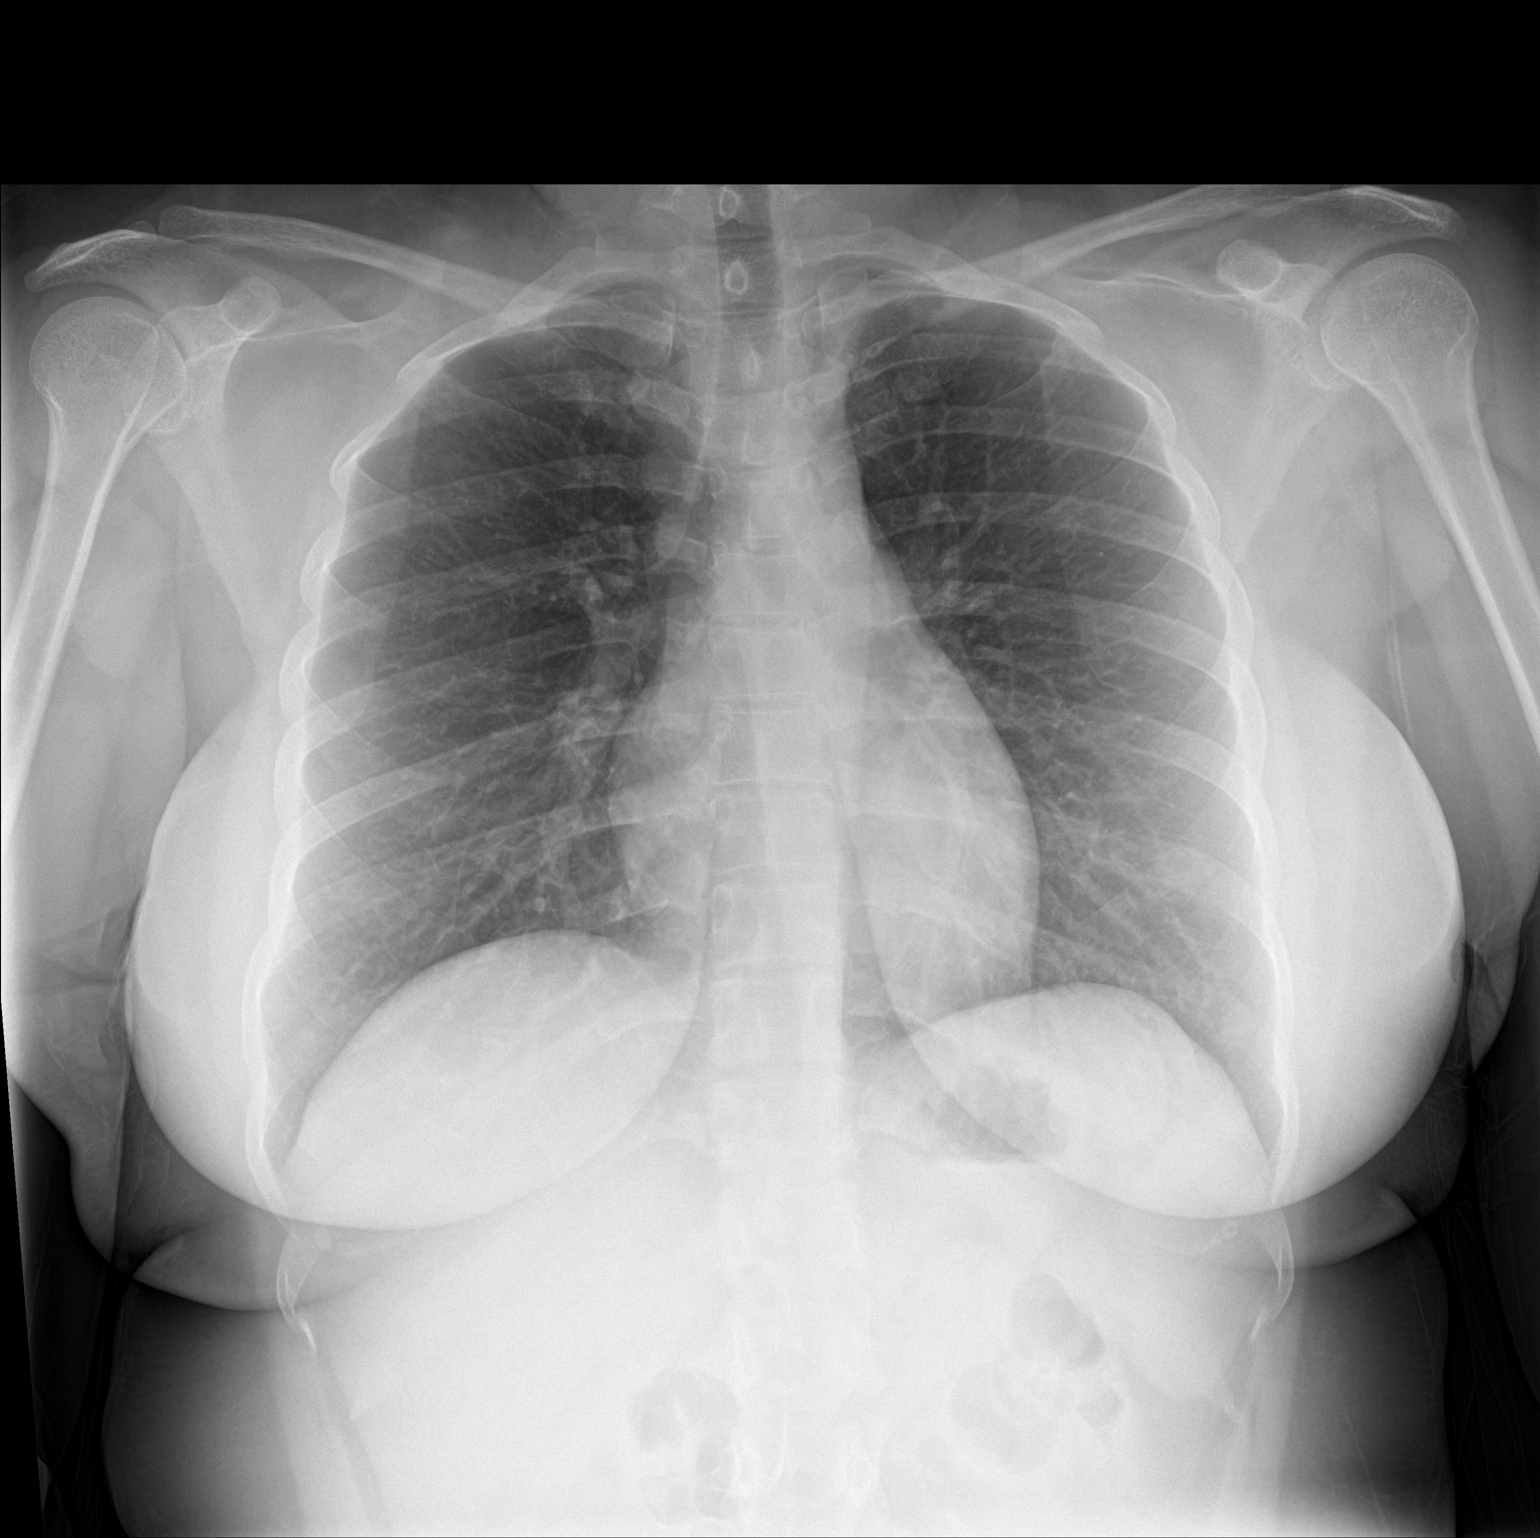

[chest lat]
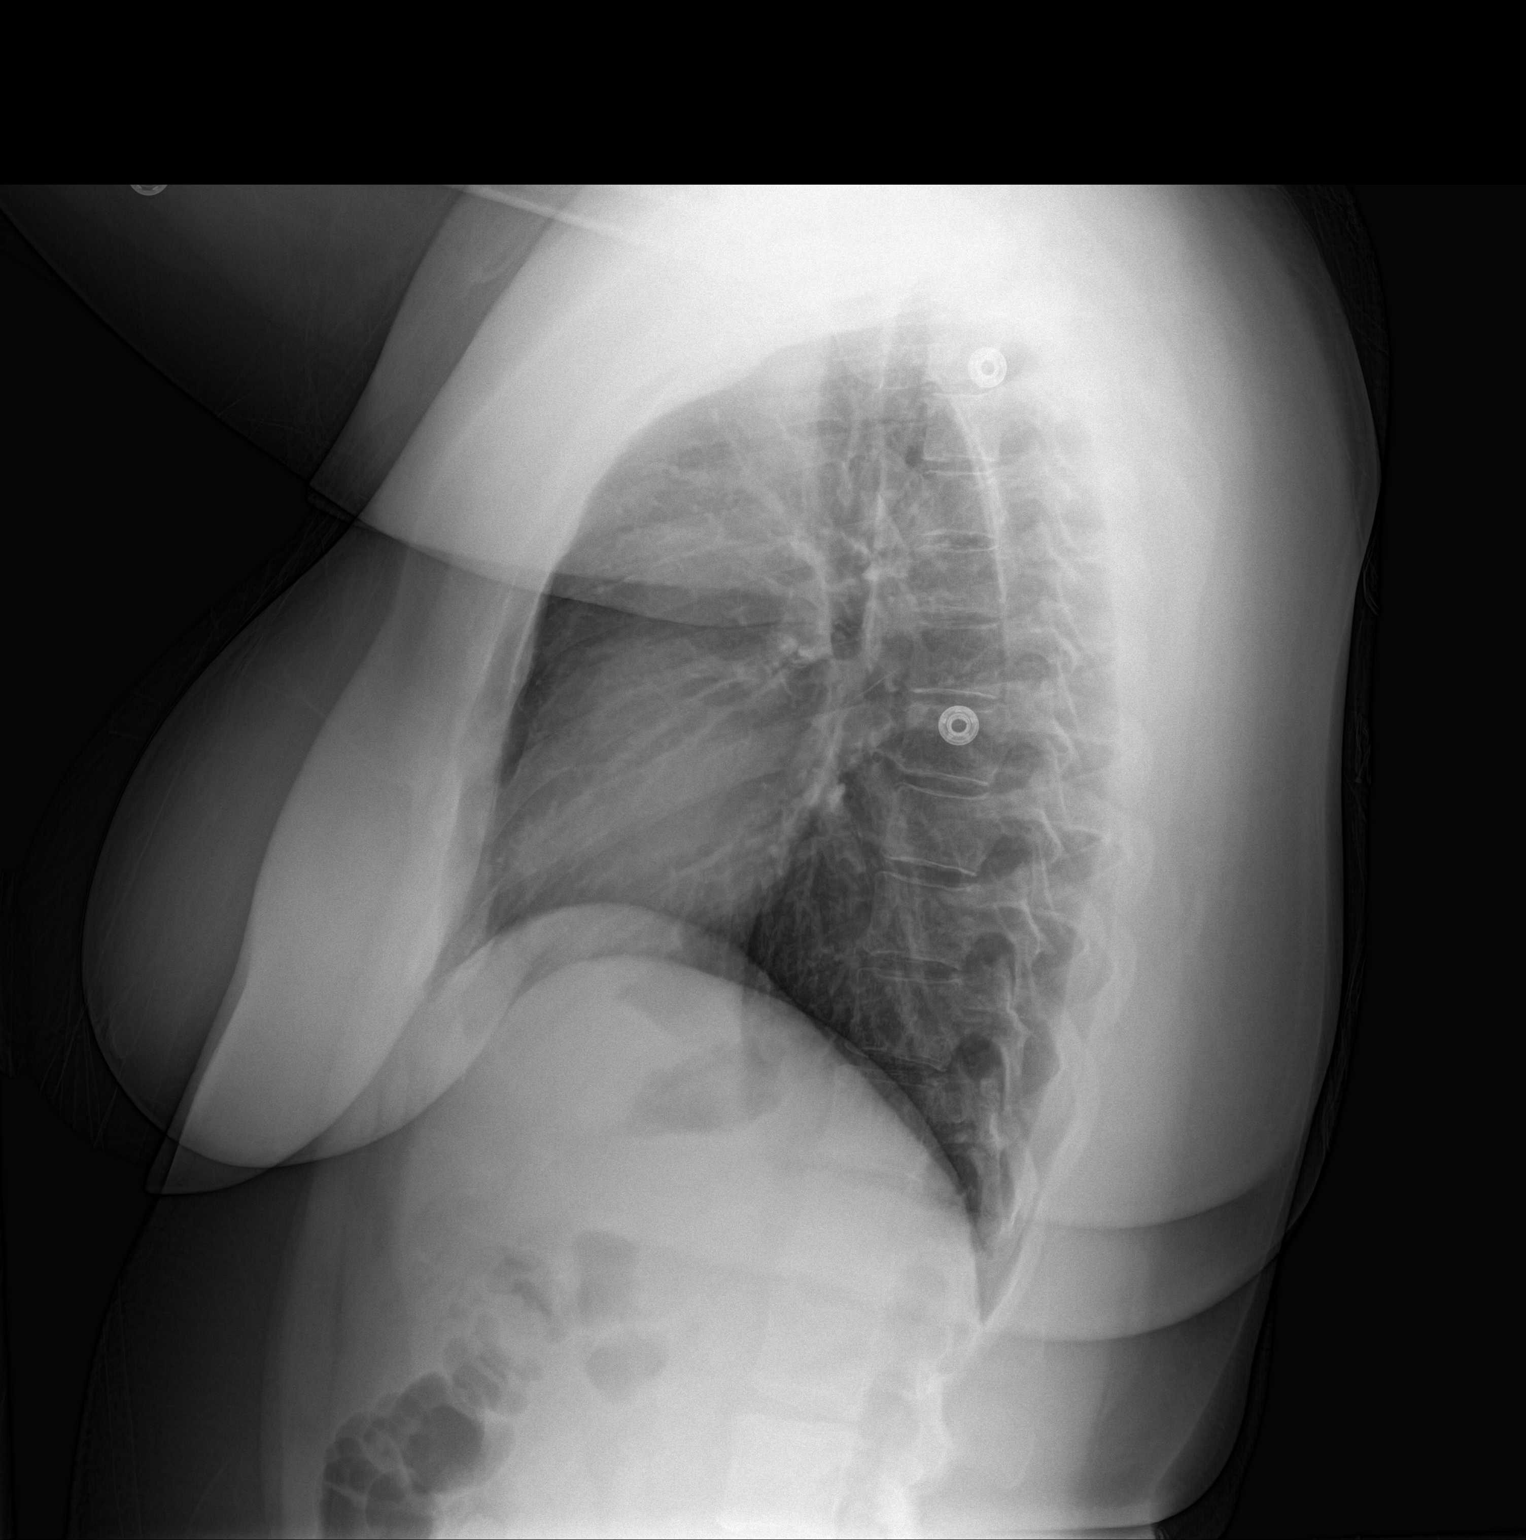

[2 of 2 positions shown; findings below may reference images not displayed]

FINDINGS: The cardiac and mediastinal silhouettes are stable in size and
contour, and remain within normal limits.

The lungs are normally inflated. No airspace consolidation, pleural
effusion, or pulmonary edema is identified. There is no
pneumothorax.

No acute osseous abnormality identified.
IMPRESSION: No active cardiopulmonary disease.

## 2018-05-25 ENCOUNTER — Other Ambulatory Visit: Payer: Self-pay | Admitting: Family Medicine

## 2018-05-27 ENCOUNTER — Other Ambulatory Visit: Payer: Self-pay | Admitting: Family Medicine

## 2018-05-27 NOTE — Telephone Encounter (Signed)
Please advise on refill request

## 2018-06-01 NOTE — Telephone Encounter (Signed)
Pt has a Web Ex appointment with dr Salomon Fick on 06/02/2018

## 2018-06-02 ENCOUNTER — Encounter: Payer: Self-pay | Admitting: Family Medicine

## 2018-06-02 ENCOUNTER — Ambulatory Visit (INDEPENDENT_AMBULATORY_CARE_PROVIDER_SITE_OTHER): Payer: BC Managed Care – PPO | Admitting: Family Medicine

## 2018-06-02 ENCOUNTER — Other Ambulatory Visit: Payer: Self-pay

## 2018-06-02 DIAGNOSIS — E063 Autoimmune thyroiditis: Secondary | ICD-10-CM | POA: Diagnosis not present

## 2018-06-02 DIAGNOSIS — E038 Other specified hypothyroidism: Secondary | ICD-10-CM | POA: Diagnosis not present

## 2018-06-02 MED ORDER — LEVOTHYROXINE SODIUM 88 MCG PO TABS: 88.0000 ug | ORAL_TABLET | Freq: Every day | ORAL | 1 refills | Status: DC

## 2018-06-02 NOTE — Progress Notes (Signed)
Virtual Visit via Video Note  I connected with Brooke Montgomery on 06/02/18 at  2:00 PM EDT by a video enabled telemedicine application and verified that I am speaking with the correct person using two identifiers.  Location patient: home Location provider:work or home office Persons participating in the virtual visit: patient, provider  I discussed the limitations of evaluation and management by telemedicine and the availability of in person appointments. The patient expressed understanding and agreed to proceed.   HPI: Pt requesting refill on thyroid medication.  She has been out of levothyroxine 88 mcg x 1 wk.  Pt denies constipation, diarrhea, palpitations, hair loss.  Pt does feel like she is starting to notice a difference not being on the med.   Otherwise pt is doing well.  Pt is a Consulting civil engineer at [employer].  She feels like she is having to do more work with the online classes, then if she was going to class each day.  ROS: See pertinent positives and negatives per HPI.  Past Medical History:  Diagnosis Date  . Hypothyroid   . Thyroid disease     No past surgical history on file.  Family History  Problem Relation Age of Onset  . Hypertension Father   . Thyroid disease Father   . Stroke Father     SOCIAL HX: Student at [employer]   Current Outpatient Medications:  .  levothyroxine (SYNTHROID, LEVOTHROID) 88 MCG tablet, Take 1 tablet (88 mcg total) daily by mouth., Disp: 90 tablet, Rfl: 3 .  medroxyPROGESTERone (PROVERA) 5 MG tablet, Take one tablet a day for 5 days every other month if no spontaneous menses, Disp: 15 tablet, Rfl: 1 .  SPRINTEC 28 0.25-35 MG-MCG tablet, Take 1 tablet daily by mouth., Disp: , Rfl: 3  EXAM:  VITALS per patient if applicable:  RR between 12-20 bpm  GENERAL: alert, oriented, appears well and in no acute distress  HEENT: atraumatic, conjunctiva clear, no obvious abnormalities on inspection of external nose and ears  NECK: normal movements of  the head and neck  LUNGS: on inspection no signs of respiratory distress, breathing rate appears normal, no obvious gross SOB, gasping or wheezing  CV: no obvious cyanosis  MS: moves all visible extremities without noticeable abnormality  PSYCH/NEURO: pleasant and cooperative, no obvious depression or anxiety, speech and thought processing grossly intact  ASSESSMENT AND PLAN:  Discussed the following assessment and plan:  Hypothyroidism due to Hashimoto's thyroiditis  - Plan: levothyroxine (SYNTHROID, LEVOTHROID) 88 MCG tablet -will obtain TSH in the next few months.  Order placed.  Pt given the option to have lab drawn now, but wishes to wait given concerns regarding COVID-19.   I discussed the assessment and treatment plan with the patient. The patient was provided an opportunity to ask questions and all were answered. The patient agreed with the plan and demonstrated an understanding of the instructions.   The patient was advised to call back or seek an in-person evaluation if the symptoms worsen or if the condition fails to improve as anticipated.  Deeann Saint, MD

## 2018-12-24 ENCOUNTER — Encounter: Payer: No Typology Code available for payment source | Admitting: Family Medicine

## 2019-03-19 DIAGNOSIS — Z20828 Contact with and (suspected) exposure to other viral communicable diseases: Secondary | ICD-10-CM | POA: Diagnosis not present

## 2019-05-20 ENCOUNTER — Ambulatory Visit: Payer: No Typology Code available for payment source | Attending: Internal Medicine

## 2019-05-29 ENCOUNTER — Ambulatory Visit: Payer: No Typology Code available for payment source | Attending: Family

## 2019-05-29 DIAGNOSIS — Z23 Encounter for immunization: Secondary | ICD-10-CM

## 2019-05-29 NOTE — Progress Notes (Signed)
   Covid-19 Vaccination Clinic  Name:  Brooke Montgomery    MRN: [id number] DOB: [birth year]  05/29/2019  [name] was observed post Covid-19 immunization for 15 minutes without incident. She was provided with Vaccine Information Sheet and instruction to access the V-Safe system.   [name] was instructed to call 911 with any severe reactions post vaccine: Marland Kitchen Difficulty breathing  . Swelling of face and throat  . A fast heartbeat  . A bad rash all over body  . Dizziness and weakness   Immunizations Administered    Name Date Dose VIS Date Route   JANSSEN COVID-19 VACCINE 05/29/2019 12:23 PM 0.5 mL 04/24/2019 Intramuscular   Manufacturer: [employer]   Lot: [id number]   NDC: 38466-599-35

## 2019-08-02 ENCOUNTER — Telehealth: Payer: Self-pay | Admitting: General Practice

## 2019-08-02 NOTE — Telephone Encounter (Signed)
Pt called in and stated that she wanted to make a new pt appt. With Dr.Cherry. I told the pt that Dr. Valentino Saxon is booked for new pt appts till September. The pt asked if we have any other providers. I informed her that we have Dr. Logan Bores he is a female and we have 2 women Midwifes, Doreene Burke and Serafina Royals. The pt stated she wanted to see a MD and a women. The pt then said that she would call back

## 2019-08-19 ENCOUNTER — Other Ambulatory Visit: Payer: Self-pay

## 2019-08-20 ENCOUNTER — Other Ambulatory Visit: Payer: Self-pay

## 2019-08-20 ENCOUNTER — Encounter: Payer: Self-pay | Admitting: Family Medicine

## 2019-08-20 ENCOUNTER — Ambulatory Visit (INDEPENDENT_AMBULATORY_CARE_PROVIDER_SITE_OTHER): Payer: BC Managed Care – PPO | Admitting: Family Medicine

## 2019-08-20 VITALS — BP 98/78 | HR 82 | Temp 97.9°F | Wt 184.0 lb

## 2019-08-20 DIAGNOSIS — Z0000 Encounter for general adult medical examination without abnormal findings: Secondary | ICD-10-CM | POA: Diagnosis not present

## 2019-08-20 DIAGNOSIS — E282 Polycystic ovarian syndrome: Secondary | ICD-10-CM

## 2019-08-20 DIAGNOSIS — Z3009 Encounter for other general counseling and advice on contraception: Secondary | ICD-10-CM

## 2019-08-20 DIAGNOSIS — E039 Hypothyroidism, unspecified: Secondary | ICD-10-CM | POA: Diagnosis not present

## 2019-08-20 LAB — COMPREHENSIVE METABOLIC PANEL
ALT: 8 U/L (ref 0–35)
AST: 8 U/L (ref 0–37)
Albumin: 4.7 g/dL (ref 3.5–5.2)
Alkaline Phosphatase: 73 U/L (ref 39–117)
BUN: 10 mg/dL (ref 6–23)
CO2: 27 mEq/L (ref 19–32)
Calcium: 9.7 mg/dL (ref 8.4–10.5)
Chloride: 104 mEq/L (ref 96–112)
Creatinine, Ser: 0.74 mg/dL (ref 0.40–1.20)
GFR: 93.3 mL/min (ref >60.00)
Glucose, Bld: 100 mg/dL — ABNORMAL HIGH (ref 70–99)
Potassium: 4.7 mEq/L (ref 3.5–5.1)
Sodium: 139 mEq/L (ref 135–145)
Total Bilirubin: 0.4 mg/dL (ref 0.2–1.2)
Total Protein: 7.5 g/dL (ref 6.0–8.3)

## 2019-08-20 LAB — CBC WITH DIFFERENTIAL/PLATELET
Basophils Absolute: 0.1 10*3/uL (ref 0.0–0.1)
Basophils Relative: 1.2 % (ref 0.0–3.0)
Eosinophils Absolute: 0.2 10*3/uL (ref 0.0–0.7)
Eosinophils Relative: 3.2 % (ref 0.0–5.0)
HCT: 26.5 % — ABNORMAL LOW (ref 36.0–46.0)
Hemoglobin: 8.2 g/dL — ABNORMAL LOW (ref 12.0–15.0)
Lymphocytes Relative: 30.8 % (ref 12.0–46.0)
Lymphs Abs: 1.7 10*3/uL (ref 0.7–4.0)
MCHC: 30.8 g/dL (ref 30.0–36.0)
MCV: 59.1 fl — ABNORMAL LOW (ref 78.0–100.0)
Monocytes Absolute: 0.4 10*3/uL (ref 0.1–1.0)
Monocytes Relative: 6.9 % (ref 3.0–12.0)
Neutro Abs: 3.2 10*3/uL (ref 1.4–7.7)
Neutrophils Relative %: 57.9 % (ref 43.0–77.0)
Platelets: 379 10*3/uL (ref 150.0–400.0)
RBC: 4.48 Mil/uL (ref 3.87–5.11)
RDW: 21.7 % — ABNORMAL HIGH (ref 11.5–15.5)
WBC: 5.6 10*3/uL (ref 4.0–10.5)

## 2019-08-20 LAB — LIPID PANEL
Cholesterol: 198 mg/dL (ref 0–200)
HDL: 37.9 mg/dL — ABNORMAL LOW (ref >39.00)
LDL Cholesterol: 138 mg/dL — ABNORMAL HIGH (ref 0–99)
NonHDL: 160.09
Total CHOL/HDL Ratio: 5
Triglycerides: 109 mg/dL (ref 0.0–149.0)
VLDL: 21.8 mg/dL (ref 0.0–40.0)

## 2019-08-20 LAB — T4, FREE: Free T4: 0.77 ng/dL (ref 0.60–1.60)

## 2019-08-20 LAB — TSH: TSH: 10.42 u[IU]/mL — ABNORMAL HIGH (ref 0.35–4.50)

## 2019-08-20 LAB — HEMOGLOBIN A1C: Hgb A1c MFr Bld: 5.6 % (ref 4.6–6.5)

## 2019-08-20 MED ORDER — PRENATAL VITAMINS 28-0.8 MG PO TABS: 1.0000 | ORAL_TABLET | Freq: Every day | ORAL | 3 refills | Status: AC

## 2019-08-20 MED ORDER — PRENATAL VITAMINS 28-0.8 MG PO TABS: 1.0000 | ORAL_TABLET | Freq: Every day | ORAL | 3 refills | Status: DC

## 2019-08-20 NOTE — Patient Instructions (Signed)
Preventive Care 21-28 Years Old, Female Preventive care refers to visits with your health care provider and lifestyle choices that can promote health and wellness. This includes:  A yearly physical exam. This may also be called an annual well check.  Regular dental visits and eye exams.  Immunizations.  Screening for certain conditions.  Healthy lifestyle choices, such as eating a healthy diet, getting regular exercise, not using drugs or products that contain nicotine and tobacco, and limiting alcohol use. What can I expect for my preventive care visit? Physical exam Your health care provider will check your:  Height and weight. This may be used to calculate body mass index (BMI), which tells if you are at a healthy weight.  Heart rate and blood pressure.  Skin for abnormal spots. Counseling Your health care provider may ask you questions about your:  Alcohol, tobacco, and drug use.  Emotional well-being.  Home and relationship well-being.  Sexual activity.  Eating habits.  Work and work environment.  Method of birth control.  Menstrual cycle.  Pregnancy history. What immunizations do I need?  Influenza (flu) vaccine  This is recommended every year. Tetanus, diphtheria, and pertussis (Tdap) vaccine  You may need a Td booster every 10 years. Varicella (chickenpox) vaccine  You may need this if you have not been vaccinated. Human papillomavirus (HPV) vaccine  If recommended by your health care provider, you may need three doses over 6 months. Measles, mumps, and rubella (MMR) vaccine  You may need at least one dose of MMR. You may also need a second dose. Meningococcal conjugate (MenACWY) vaccine  One dose is recommended if you are age 19-21 years and a first-year college student living in a residence hall, or if you have one of several medical conditions. You may also need additional booster doses. Pneumococcal conjugate (PCV13) vaccine  You may need  this if you have certain conditions and were not previously vaccinated. Pneumococcal polysaccharide (PPSV23) vaccine  You may need one or two doses if you smoke cigarettes or if you have certain conditions. Hepatitis A vaccine  You may need this if you have certain conditions or if you travel or work in places where you may be exposed to hepatitis A. Hepatitis B vaccine  You may need this if you have certain conditions or if you travel or work in places where you may be exposed to hepatitis B. Haemophilus influenzae type b (Hib) vaccine  You may need this if you have certain conditions. You may receive vaccines as individual doses or as more than one vaccine together in one shot (combination vaccines). Talk with your health care provider about the risks and benefits of combination vaccines. What tests do I need?  Blood tests  Lipid and cholesterol levels. These may be checked every 5 years starting at age 20.  Hepatitis C test.  Hepatitis B test. Screening  Diabetes screening. This is done by checking your blood sugar (glucose) after you have not eaten for a while (fasting).  Sexually transmitted disease (STD) testing.  BRCA-related cancer screening. This may be done if you have a family history of breast, ovarian, tubal, or peritoneal cancers.  Pelvic exam and Pap test. This may be done every 3 years starting at age 21. Starting at age 30, this may be done every 5 years if you have a Pap test in combination with an HPV test. Talk with your health care provider about your test results, treatment options, and if necessary, the need for more tests.   Follow these instructions at home: Eating and drinking   Eat a diet that includes fresh fruits and vegetables, whole grains, lean protein, and low-fat dairy.  Take vitamin and mineral supplements as recommended by your health care provider.  Do not drink alcohol if: ? Your health care provider tells you not to drink. ? You are  pregnant, may be pregnant, or are planning to become pregnant.  If you drink alcohol: ? Limit how much you have to 0-1 drink a day. ? Be aware of how much alcohol is in your drink. In the [place] one drink equals one 12 oz bottle of beer (355 mL), one 5 oz glass of wine (148 mL), or one 1 oz glass of hard liquor (44 mL). Lifestyle  Take daily care of your teeth and gums.  Stay active. Exercise for at least 30 minutes on 5 or more days each week.  Do not use any products that contain nicotine or tobacco, such as cigarettes, e-cigarettes, and chewing tobacco. If you need help quitting, ask your health care provider.  If you are sexually active, practice safe sex. Use a condom or other form of birth control (contraception) in order to prevent pregnancy and STIs (sexually transmitted infections). If you plan to become pregnant, see your health care provider for a preconception visit. What's next?  Visit your health care provider once a year for a well check visit.  Ask your health care provider how often you should have your eyes and teeth checked.  Stay up to date on all vaccines. This information is not intended to replace advice given to you by your health care provider. Make sure you discuss any questions you have with your health care provider. Document Revised: 10/23/2017 Document Reviewed: 10/23/2017 Elsevier Patient Education  [place].  Pregnancy and Hypothyroidism Hypothyroidism is a condition that develops if your thyroid has low activity. The thyroid is a small, butterfly-shaped gland in your neck. It is located in front of your windpipe. It makes hormones that play an important role in regulating your breathing, heart rate, menstrual cycle, body temperature, and other bodily functions. If you have hypothyroidism, your thyroid gland does not produce enough thyroid hormones. When you are pregnant, your body uses more thyroid hormones. This can cause mild hypothyroidism to  get worse. How does this affect me? Hypothyroidism during pregnancy can cause you to have:  Fatigue.  Abnormal weight gain. For women of normal weight, it is common to gain about 1 pound per week during pregnancy.  Difficulty having a bowel movement (constipation).  Feeling cold more often than others do.  Muscle aches.  Pregnancy complications, such as: ? High blood pressure that develops after the 20th week of pregnancy (preeclampsia). ? Pregnancy loss (miscarriage). ? Preterm birth. ? Placenta problems. How does this affect my baby? Hypothyroidism can also affect your baby. Babies need thyroid hormone from their mothers for normal growth and brain development. Babies born to mothers with hypothyroidism during pregnancy may:  Be born prematurely.  Have low birth weight.  Have mental delays.  May develop hypothyroidism. This is rare. What can I do to lower my risk? Some women with hypothyroidism need extra iodine during pregnancy. Your health care provider may recommend that you:  Eat foods with iodine, such as: ? Iodized salt. ? Pasteurized eggs and dairy products. ? Low-mercury seafood.  Take a prenatal vitamin that contains iodine.  Take iodine supplements. How is this treated? Treatment may include:  Monitoring. If you have  mild hypothyroidism, your health care provider will monitor your thyroid hormone levels closely to watch for any changes.  Medicines. Your health care provider may prescribe medicine to control your thyroid hormone levels. Follow these instructions at home:  Take over-the-counter and prescription medicines only as told by your health care provider. ? Check with your health care provider before taking any hypothyroid medicines that were prescribed before you became pregnant. Many are safe, but some treatments for hypothyroidism may have to be stopped during pregnancy.  You may be asked to perform kick counts to monitor your baby's movements.  If your baby moves fewer than 10 times in 2 hours during a period when the baby is usually active (typically in the evening), you should see your health care provider right away.  Take a prenatal vitamin as told by your health care provider.  Keep all follow-up visits. This is important. Contact a health care provider if you:  Have new symptoms or your symptoms get worse.  Gain more than 5 lb (2.3 kg) in 1 week.  Have a lump in your neck.  Have a scratchy throat or difficulty speaking that lasts longer than a month and is not related to a cold.  Have a hard time swallowing. Get help right away if:  Your baby is less active than normal.  Your baby stops moving completely.  You develop muscle cramps.  You have pain in your abdomen.  You have heavy bleeding.  You develop a fever or chills.  You have a very bad headache or vision problems.  You develop swelling in your legs and ankles. Summary  Hypothyroidism is a condition that develops if your thyroid has low activity.  Hypothyroidism during pregnancy can lead to complications for both you and your baby.  Take medicines, vitamins, and supplements as told by your health care provider during your pregnancy to control your condition.  Keep regular prenatal appointments so your health care provider can closely monitor your condition during pregnancy. This information is not intended to replace advice given to you by your health care provider. Make sure you discuss any questions you have with your health care provider. Document Revised: 06/05/2018 Document Reviewed: 03/18/2017 Elsevier Patient Education  2020 [employer].  Preparing for Pregnancy If you are considering becoming pregnant, make an appointment to see your regular health care provider to learn how to prepare for a safe and healthy pregnancy (preconception care). During a preconception care visit, your health care provider will:  Do a complete physical exam,  including a Pap test.  Take a complete medical history.  Give you information, answer your questions, and help you resolve problems. Preconception checklist Medical history  Tell your health care provider about any current or past medical conditions. Your pregnancy or your ability to become pregnant may be affected by chronic conditions, such as diabetes, chronic hypertension, and thyroid problems.  Include your family's medical history as well as your partner's medical history.  Tell your health care provider about any history of STIs (sexually transmitted infections).These can affect your pregnancy. In some cases, they can be passed to your baby. Discuss any concerns that you have about STIs.  If indicated, discuss the benefits of genetic testing. This testing will show whether there are any genetic conditions that may be passed from you or your partner to your baby.  Tell your health care provider about: ? Any problems you have had with conception or pregnancy. ? Any medicines you take. These include vitamins,  herbal supplements, and over-the-counter medicines. ? Your history of immunizations. Discuss any vaccinations that you may need. Diet  Ask your health care provider what to include in a healthy diet that has a balance of nutrients. This is especially important when you are pregnant or preparing to become pregnant.  Ask your health care provider to help you reach a healthy weight before pregnancy. ? If you are overweight, you may be at higher risk for certain complications, such as high blood pressure, diabetes, and preterm birth. ? If you are underweight, you are more likely to have a baby who has a low birth weight. Lifestyle, work, and home  Let your health care provider know: ? About any lifestyle habits that you have, such as alcohol use, drug use, or smoking. ? About recreational activities that may put you at risk during pregnancy, such as downhill skiing and certain  exercise programs. ? Tell your health care provider about any international travel, especially any travel to places with an active [place] virus outbreak. ? About harmful substances that you may be exposed to at work or at home. These include chemicals, pesticides, radiation, or even litter boxes. ? If you do not feel safe at home. Mental health  Tell your health care provider about: ? Any history of mental health conditions, including feelings of depression, sadness, or anxiety. ? Any medicines that you take for a mental health condition. These include herbs and supplements. Home instructions to prepare for pregnancy Lifestyle   Eat a balanced diet. This includes fresh fruits and vegetables, whole grains, lean meats, low-fat dairy products, healthy fats, and foods that are high in fiber. Ask to meet with a nutritionist or registered dietitian for assistance with meal planning and goals.  Get regular exercise. Try to be active for at least 30 minutes a day on most days of the week. Ask your health care provider which activities are safe during pregnancy.  Do not use any products that contain nicotine or tobacco, such as cigarettes and e-cigarettes. If you need help quitting, ask your health care provider.  Do not drink alcohol.  Do not take illegal drugs.  Maintain a healthy weight. Ask your health care provider what weight range is right for you. General instructions  Keep an accurate record of your menstrual periods. This makes it easier for your health care provider to determine your baby's due date.  Begin taking prenatal vitamins and folic acid supplements daily as directed by your health care provider.  Manage any chronic conditions, such as high blood pressure and diabetes, as told by your health care provider. This is important. How do I know that I am pregnant? You may be pregnant if you have been sexually active and you miss your period. Symptoms of early pregnancy  include:  Mild cramping.  Very light vaginal bleeding (spotting).  Feeling unusually tired.  Nausea and vomiting (morning sickness). If you have any of these symptoms and you suspect that you might be pregnant, you can take a home pregnancy test. These tests check for a hormone in your urine (human chorionic gonadotropin, or hCG). A woman's body begins to make this hormone during early pregnancy. These tests are very accurate. Wait until at least the first day after you miss your period to take one. If the test shows that you are pregnant (you get a positive result), call your health care provider to make an appointment for prenatal care. What should I do if I become pregnant?  Make an appointment with your health care provider as soon as you suspect you are pregnant.  Do not use any products that contain nicotine, such as cigarettes, chewing tobacco, and e-cigarettes. If you need help quitting, ask your health care provider.  Do not drink alcoholic beverages. Alcohol is related to a number of birth defects.  Avoid toxic odors and chemicals.  You may continue to have sexual intercourse if it does not cause pain or other problems, such as vaginal bleeding. This information is not intended to replace advice given to you by your health care provider. Make sure you discuss any questions you have with your health care provider. Document Revised: 02/13/2017 Document Reviewed: 09/03/2015 Elsevier Patient Education  [place].

## 2019-08-20 NOTE — Progress Notes (Signed)
Subjective:     Brooke Montgomery is a 29 y.o. female and is here for a comprehensive physical exam. The patient reports problems - Thyroid.  Pt has a h/o hypothyroidism.  States has been out of medication x4 months as she ran out of refills.  Pt states she contacted clinic for refills, but did not hear back.  Per chart review no follow notes seen.  Pt states since being off medication she is actually felt better, menses has become regular.  LMP 07/31/2019 lasting 4 days.  Pt denies constipation, diarrhea, palpitations, heat or cold intolerance.  Patient notes regular hair shedding.  Pt notes history of PCOS and wanting to become pregnant.  Pt has an appointment with OB/GYN July 7.  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 Social History   Socioeconomic History   Marital status: Married    Spouse name: Not on file   Number of children: Not on file   Years of education: Not on file   Highest education level: Not on file  Occupational History   Not on file  Tobacco Use   Smoking status: Never Smoker   Smokeless tobacco: Never Used  Vaping Use   Vaping Use: Never used  Substance and Sexual Activity   Alcohol use: No   Drug use: No   Sexual activity: Yes    Partners: Male    Birth control/protection: Pill  Other Topics Concern   Not on file  Social History Narrative   Not on file   Social Determinants of Health   Financial Resource Strain:    Difficulty of Paying Living Expenses:   Food Insecurity:    Worried About Charity fundraiser in the Last Year:    Arboriculturist in the Last Year:   Transportation Needs:    Film/video editor (Medical):    Lack of Transportation (Non-Medical):   Physical Activity:    Days of Exercise per Week:    Minutes of Exercise per Session:   Stress:    Feeling of Stress :   Social Connections:    Frequency of Communication with Friends and Family:    Frequency of Social Gatherings with Friends and  Family:    Attends Religious Services:    Active Member of Clubs or Organizations:    Attends Music therapist:    Marital Status:   Intimate Partner Violence:    Fear of Current or Ex-Partner:    Emotionally Abused:    Physically Abused:    Sexually Abused:    Health Maintenance  Topic Date Due   Hepatitis C Screening  Never done   HIV Screening  Never done   TETANUS/TDAP  Never done   PAP-Cervical Cytology Screening  08/02/2019   PAP SMEAR-Modifier  08/02/2019   INFLUENZA VACCINE  09/26/2019   COVID-19 Vaccine  Completed    The following portions of the patient's history were reviewed and updated as appropriate: allergies, current medications, past family history, past medical history, past social history, past surgical history and problem list.  Review of Systems Pertinent items noted in HPI and remainder of comprehensive ROS otherwise negative.   Objective:    BP 98/78 (BP Location: Left Arm, Patient Position: Sitting, Cuff Size: Large)    Pulse 82    Temp 97.9 F (36.6 C) (Temporal)    Wt 184 lb (83.5 kg)    LMP 07/31/2019 (Exact Date)    SpO2 99%    BMI  34.20 kg/m  General appearance: alert, cooperative and no distress Head: Normocephalic, without obvious abnormality, atraumatic Eyes: conjunctivae/corneas clear. PERRL, EOM's intact. Fundi benign. Ears: normal TM's and external ear canals both ears Nose: Nares normal. Septum midline. Mucosa normal. No drainage or sinus tenderness. Throat: lips, mucosa, and tongue normal; teeth and gums normal Neck: no adenopathy, no carotid bruit, no JVD, supple, symmetrical, trachea midline and Neck full, thyroid normal in size without nodule Lungs: clear to auscultation bilaterally Heart: regular rate and rhythm, S1, S2 normal, no murmur, click, rub or gallop Abdomen: soft, non-tender; bowel sounds normal; no masses,  no organomegaly Extremities: extremities normal, atraumatic, no cyanosis or edema Pulses:  2+ and symmetric Skin: Skin color, texture, turgor normal. No rashes or lesions acanthosis nigricans on lateral neck Lymph nodes: Cervical, supraclavicular, and axillary nodes normal. Neurologic: Alert and oriented X 3, normal strength and tone. Normal symmetric reflexes. Normal coordination and gait    Assessment:    Healthy female exam with h/o hypothyroidism not currently on medication.     Plan:     Anticipatory guidance given including wearing seatbelts, smoke detectors in the home, increasing physical activity, increasing p.o. intake of water and vegetables. -We will obtain labs--CMP, CBC, TSH, free T4, HIV, RPR, lipid panel, hemoglobin A1c -Patient to obtain Pap with OB/GYN in July -Given handouts -Next CPE in 1 year See After Visit Summary for Counseling Recommendations    Acquired hypothyroidism -Patient off medication x1 month.  Will obtain labs. -Discussed restarting medication if needed.  TSH normal continue close monitoring. -Given handout - Plan: TSH, T4, Free  PCOS (polycystic ovarian syndrome)  - Plan: Hemoglobin A1c, Lipid panel  Family planning counseling  -Discussed starting prenatal vitamins -Also discussed changes anemia, thyroid during pregnancy -Encouraged to keep follow-up appointment with OB/GYN -Given handout - Plan: HIV, RPR, Prenatal Vit-Fe Fumarate-FA (PRENATAL VITAMINS) 28-0.8 MG TABS  Follow-up as needed  Abbe Amsterdam, MD

## 2019-08-23 LAB — RPR: RPR Ser Ql: NONREACTIVE

## 2019-08-23 LAB — HIV ANTIBODY (ROUTINE TESTING W REFLEX): HIV 1&2 Ab, 4th Generation: NONREACTIVE

## 2019-08-27 ENCOUNTER — Other Ambulatory Visit: Payer: Self-pay

## 2019-08-27 ENCOUNTER — Other Ambulatory Visit: Payer: Self-pay | Admitting: Family Medicine

## 2019-08-27 DIAGNOSIS — D509 Iron deficiency anemia, unspecified: Secondary | ICD-10-CM

## 2019-08-27 MED ORDER — FERROUS SULFATE 325 (65 FE) MG PO TBEC: 325.0000 mg | DELAYED_RELEASE_TABLET | Freq: Every day | ORAL | 3 refills | Status: DC

## 2019-08-31 ENCOUNTER — Other Ambulatory Visit: Payer: Self-pay | Admitting: Family Medicine

## 2019-08-31 ENCOUNTER — Other Ambulatory Visit: Payer: Self-pay

## 2019-08-31 DIAGNOSIS — E038 Other specified hypothyroidism: Secondary | ICD-10-CM

## 2019-08-31 DIAGNOSIS — E063 Autoimmune thyroiditis: Secondary | ICD-10-CM

## 2019-08-31 MED ORDER — LEVOTHYROXINE SODIUM 88 MCG PO TABS: 88.0000 ug | ORAL_TABLET | Freq: Every day | ORAL | 1 refills | Status: DC

## 2019-09-29 ENCOUNTER — Other Ambulatory Visit: Payer: Self-pay

## 2019-09-29 ENCOUNTER — Telehealth: Payer: Self-pay | Admitting: Family Medicine

## 2019-09-29 DIAGNOSIS — D509 Iron deficiency anemia, unspecified: Secondary | ICD-10-CM

## 2019-09-29 LAB — CBC WITH DIFFERENTIAL/PLATELET
Absolute Monocytes: 378 cells/uL (ref 200–950)
Basophils Absolute: 42 cells/uL (ref 0–200)
Basophils Relative: 0.7 %
Eosinophils Absolute: 186 cells/uL (ref 15–500)
Eosinophils Relative: 3.1 %
HCT: 35.8 % (ref 35.0–45.0)
Hemoglobin: 10.8 g/dL — ABNORMAL LOW (ref 11.7–15.5)
Lymphs Abs: 1830 cells/uL (ref 850–3900)
MCH: 21.9 pg — ABNORMAL LOW (ref 27.0–33.0)
MCHC: 30.2 g/dL — ABNORMAL LOW (ref 32.0–36.0)
MCV: 72.6 fL — ABNORMAL LOW (ref 80.0–100.0)
MPV: 10.6 fL (ref 7.5–12.5)
Monocytes Relative: 6.3 %
Neutro Abs: 3564 cells/uL (ref 1500–7800)
Neutrophils Relative %: 59.4 %
Platelets: 372 10*3/uL (ref 140–400)
RBC: 4.93 10*6/uL (ref 3.80–5.10)
Total Lymphocyte: 30.5 %
WBC: 6 10*3/uL (ref 3.8–10.8)

## 2019-09-29 NOTE — Addendum Note (Signed)
Addended by: Margy Clarks on: 09/29/2019 12:55 PM   Modules accepted: Orders

## 2019-09-29 NOTE — Telephone Encounter (Signed)
error 

## 2019-11-18 ENCOUNTER — Other Ambulatory Visit: Payer: Self-pay | Admitting: Family Medicine

## 2019-11-18 DIAGNOSIS — E038 Other specified hypothyroidism: Secondary | ICD-10-CM

## 2019-11-19 ENCOUNTER — Telehealth: Payer: Self-pay | Admitting: Family Medicine

## 2019-11-19 ENCOUNTER — Other Ambulatory Visit: Payer: Self-pay

## 2019-11-19 DIAGNOSIS — D509 Iron deficiency anemia, unspecified: Secondary | ICD-10-CM

## 2019-11-19 DIAGNOSIS — Z3009 Encounter for other general counseling and advice on contraception: Secondary | ICD-10-CM

## 2019-11-19 MED ORDER — FERROUS SULFATE 325 (65 FE) MG PO TBEC: 325.0000 mg | DELAYED_RELEASE_TABLET | Freq: Every day | ORAL | 3 refills | Status: AC

## 2019-11-19 MED ORDER — LEVOTHYROXINE SODIUM 88 MCG PO TABS: 88.0000 ug | ORAL_TABLET | Freq: Every day | ORAL | 0 refills | Status: AC

## 2019-11-19 NOTE — Telephone Encounter (Signed)
Please see patient request.

## 2019-11-19 NOTE — Telephone Encounter (Signed)
Pt would like a call back

## 2019-11-19 NOTE — Telephone Encounter (Signed)
Pt medication refill sent o the pharmacy requested
